# Patient Record
Sex: Male | Born: 1955 | Race: White | Hispanic: No | Marital: Married | Smoking: Never smoker
Health system: Southern US, Community
[De-identification: ages and names within clinical notes are randomized; demographics above are authoritative.]

## PROBLEM LIST (undated history)

## (undated) ENCOUNTER — Emergency Department: Discharge status: Absent without leave | Payer: Self-pay

## (undated) DIAGNOSIS — E66812 Obesity, class 2: Secondary | ICD-10-CM

## (undated) DIAGNOSIS — E669 Obesity, unspecified: Secondary | ICD-10-CM

## (undated) DIAGNOSIS — S82892A Other fracture of left lower leg, initial encounter for closed fracture: Secondary | ICD-10-CM

## (undated) DIAGNOSIS — R55 Syncope and collapse: Secondary | ICD-10-CM

## (undated) DIAGNOSIS — E785 Hyperlipidemia, unspecified: Secondary | ICD-10-CM

## (undated) DIAGNOSIS — K635 Polyp of colon: Secondary | ICD-10-CM

## (undated) HISTORY — DX: Syncope and collapse: R55

## (undated) HISTORY — DX: Obesity, class 2: E66.812

## (undated) HISTORY — PX: COLONOSCOPY W/ POLYPECTOMY: SHX1380

## (undated) HISTORY — DX: Other fracture of left lower leg, initial encounter for closed fracture: S82.892A

## (undated) HISTORY — DX: Polyp of colon: K63.5

## (undated) HISTORY — PX: HEMORRHOID SURGERY: SHX153

## (undated) HISTORY — DX: Obesity, unspecified: E66.9

## (undated) HISTORY — DX: Hyperlipidemia, unspecified: E78.5

---
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
## 2011-03-30 ENCOUNTER — Ambulatory Visit (INDEPENDENT_AMBULATORY_CARE_PROVIDER_SITE_OTHER): Payer: BC Managed Care – PPO | Admitting: Family Medicine

## 2011-03-30 ENCOUNTER — Encounter: Payer: Self-pay | Admitting: Family Medicine

## 2011-03-30 ENCOUNTER — Telehealth: Payer: Self-pay | Admitting: Family Medicine

## 2011-03-30 VITALS — BP 124/70 | HR 74 | Temp 98.5°F | Ht 75.0 in | Wt 291.0 lb

## 2011-03-30 DIAGNOSIS — L919 Hypertrophic disorder of the skin, unspecified: Secondary | ICD-10-CM

## 2011-03-30 DIAGNOSIS — L909 Atrophic disorder of skin, unspecified: Secondary | ICD-10-CM

## 2011-03-30 DIAGNOSIS — Z0000 Encounter for general adult medical examination without abnormal findings: Secondary | ICD-10-CM

## 2011-03-30 DIAGNOSIS — L918 Other hypertrophic disorders of the skin: Secondary | ICD-10-CM | POA: Insufficient documentation

## 2011-03-30 LAB — COMPREHENSIVE METABOLIC PANEL
ALT: 26 U/L (ref 0–53)
AST: 20 U/L (ref 0–37)
CO2: 31 mEq/L (ref 19–32)
Calcium: 9.5 mg/dL (ref 8.4–10.5)
Chloride: 107 mEq/L (ref 96–112)
GFR: 59.71 mL/min — ABNORMAL LOW (ref >60.00)
Sodium: 144 mEq/L (ref 135–145)
Total Protein: 6.7 g/dL (ref 6.0–8.3)

## 2011-03-30 LAB — CBC WITH DIFFERENTIAL/PLATELET
Basophils Absolute: 0 10*3/uL (ref 0.0–0.1)
Eosinophils Absolute: 0.2 10*3/uL (ref 0.0–0.7)
HCT: 48.4 % (ref 39.0–52.0)
Hemoglobin: 15.9 g/dL (ref 13.0–17.0)
Lymphs Abs: 1.8 10*3/uL (ref 0.7–4.0)
MCHC: 32.9 g/dL (ref 30.0–36.0)
MCV: 91.1 fl (ref 78.0–100.0)
Monocytes Absolute: 0.5 10*3/uL (ref 0.1–1.0)
Neutro Abs: 4.5 10*3/uL (ref 1.4–7.7)
RDW: 13.1 % (ref 11.5–14.6)

## 2011-03-30 LAB — TSH: TSH: 1.93 u[IU]/mL (ref 0.35–5.50)

## 2011-03-30 NOTE — Telephone Encounter (Signed)
Done

## 2011-03-30 NOTE — Assessment & Plan Note (Signed)
Discussed primary problem of being overweight: prudent diet and exercise recommendations reviewed, HM handout for adult males reviewed and given to patient. Reviewed age appropriate health screening and vaccines.  He declined flu vaccine today.  We'll see if old records show date of last Tetanus. Will review old records for date of his colonoscopy and whether the polyps were adenomatous or hyperplastic and go from there as far as recommendation for next screening colonoscopy. FLP, CBC, CMET, TSH, and PSA done today. He deferred the GU and rectal exams. Next CPE 1 yr.

## 2011-03-30 NOTE — Patient Instructions (Signed)
Health Maintenance in Males MAINTAIN REGULAR HEALTH EXAMS  Maintain a healthy diet and normal weight. Increased weight leads to problems with blood pressure and diabetes. Decrease fat in the diet and increase exercise. Obtain a proper diet from your caregiver if necessary.   High blood pressure causes heart and blood vessel problems. Check blood pressures regularly and keep your blood pressure at normal limits. Aerobic exercise helps this. Persistent elevations of blood pressure should be treated with medications if weight loss and exercise are ineffective.   Avoid smoking, drinking in excess (more than 2 drinks per day), or use of street drugs. Do not share needles with anyone. Ask for help if you need assistance or instructions on stopping the use of alcohol, cigarettes, or drugs.   Maintain normal blood lipids and cholesterol. Your caregiver can give you information to lower your risk of heart disease or stroke.   Ask your caregiver if you are in need of early heart disease screening because of a strong family history of heart disease or signs of elevated testosterone (male sex hormone) levels. These can predispose you to early heart disease.   Practice safe sex. Practicing safe sex decreases your risk for a sexually transmitted infection (STI). Some of the STIs are gonorrhea, chlamydia, syphilis, trichimonas, herpes, human papillomavirus (HPV), and human immunodeficiency virus (HIV). Herpes, HIV, and HPV are viral illnesses that have no cure. These can result in disability, cancer, and death.   It is not safe for someone who has AIDS or is HIV positive to have unprotected sex with a partner who is HIV positive. The reason for this is the fact that there are many different strains of HIV. If you have a strain that is readily treated with medications and then suddenly introduce a strain from a partner that has no further treatment options, you may suddenly have a strain of HIV that is untreatable.  Even if you are both positive for HIV, it is still necessary to practice safe sex.   Use sunscreen with a SPF of 15 or greater. Being outside in the sun when your shadow caused by the sun is shorter than you are, means you are being exposed to sun at greater intensity. Lighter skinned people are at a greater risk of skin cancer.   Keep carbon monoxide and smoke detectors in your home and functioning at all times. Change the batteries every 6 months.   Do monthly examinations of your testicles. The best time to do this is after a hot shower or bath when the tissues are loose. Notify your caregivers of any lumps, tenderness, or changes in size or shape.   Notify your caregiver of new moles or changes in moles, especially if there is a change in shape or color. Also notify your caregiver if a mole is larger than the size of a pencil eraser.   Stay current with your tetanus shots and other required immunizations.  The Body Mass Index (BMI) is a way of measuring how much of your body is fat. Having a BMI above 27 increases the risk of heart disease, diabetes, hypertension, stroke, and other problems related to obesity. Document Released: 12/01/2007 Document Re-Released: 11/22/2009 Exit[employer] Patient Information 2011 [employer]. 

## 2011-03-30 NOTE — Telephone Encounter (Signed)
Pls request records from Dr. Jaynie Collins in [place], [place].  Thx--PM

## 2011-03-30 NOTE — Assessment & Plan Note (Signed)
Three in right axilla and 2 in left: these were removed today via simple excision with scalpel.  Pt tolerated procedure well, no immediate complications.

## 2011-03-30 NOTE — Progress Notes (Signed)
Office Note 03/30/2011  CC:  Chief Complaint  Patient presents with  . Annual Exam    no complaints    HPI:  Raymond Griffith is a 55 y.o. White male who is here to establish care and get CPE. Patient's most recent primary MD: Dr. Jaynie Collins in [place], [place]. Old records were not reviewed prior to or during today's visit.  Feeling well, no acute complaints. Asks if I can remove a few skin tags in his armpits today.   Past Medical History  Diagnosis Date  . Colon polyps approx age 74-48    Screening: Pt doesn't recall whether adenomatous or hyperplastic  This screening was done a bit early but patient admits he is NOT high risk.    Past Surgical History  Procedure Date  . Tonsillectomy and adenoidectomy   Hemorrhoidectomy  Family History  Problem Relation Age of Onset  . Diabetes Mother   . Diabetes Father   . Cancer Paternal Grandmother     ovarian    History   Social History  . Marital Status: Married    Spouse Name: N/A    Number of Children: N/A  . Years of Education: N/A   Occupational History  . Not on file.   Social History Main Topics  . Smoking status: Never Smoker   . Smokeless tobacco: Never Used  . Alcohol Use: Not on file  . Drug Use: Not on file  . Sexually Active: Not on file   Other Topics Concern  . Not on file   Social History Narrative   Married, one adult son.Originally from [place] but went to [employer], got accounting degree.Works as an Airline pilot (corperate taxes).Exercises in spurts only.  No T/A/Ds.    Outpatient Encounter Prescriptions as of 03/30/2011  Medication Sig Dispense Refill  . Multiple Vitamins-Minerals (CENTRUM SILVER PO) Take 1 tablet by mouth daily.          No Known Allergies  ROS Review of Systems  Constitutional: Negative for fever, chills, appetite change and fatigue.  HENT: Negative for ear pain, congestion, sore throat, neck stiffness and dental problem.   Eyes: Negative for discharge, redness  and visual disturbance.  Respiratory: Negative for cough, chest tightness, shortness of breath and wheezing.   Cardiovascular: Negative for chest pain, palpitations and leg swelling.  Gastrointestinal: Negative for nausea, vomiting, abdominal pain, diarrhea and blood in stool.  Genitourinary: Negative for dysuria, urgency, frequency, hematuria, flank pain and difficulty urinating.  Musculoskeletal: Negative for myalgias, back pain, joint swelling and arthralgias.  Skin: Negative for pallor and rash.  Neurological: Negative for dizziness, speech difficulty, weakness and headaches.  Hematological: Negative for adenopathy. Does not bruise/bleed easily.  Psychiatric/Behavioral: Negative for confusion and sleep disturbance. The patient is not nervous/anxious.      PE; Blood pressure 124/70, pulse 74, temperature 98.5 F (36.9 C), temperature source Oral, height 6\' 3"  (1.905 m), weight 291 lb (131.997 kg), SpO2 96.00%. Gen: Alert, well appearing.  Patient is oriented to person, place, time, and situation. HEENT: Scalp without lesions or hair loss.  Ears: EACs clear, normal epithelium.  TMs with good light reflex and landmarks bilaterally.  Eyes: no injection, icteris, swelling, or exudate.  EOMI, PERRLA. Nose: no drainage or turbinate edema/swelling.  No injection or focal lesion.  Mouth: lips without lesion/swelling.  Oral mucosa pink and moist.  Dentition intact and without obvious caries or gingival swelling.  Oropharynx without erythema, exudate, or swelling.  Neck: supple, ROM full.  Carotids 2+ bilat,  without bruit.  No lymphadenopathy, thyromegaly, or mass. Chest: symmetric expansion, nonlabored respirations.  Clear and equal breath sounds in all lung fields.   CV: RRR, no m/r/g.  Peripheral pulses 2+ and symmetric. ABD: soft, NT, ND, BS normal.  No hepatospenomegaly or mass.  No bruits. EXT: no clubbing, cyanosis, or edema.  Neuro: CN 2-12 intact bilaterally, strength 5/5 in proximal and  distal upper extremities and lower extremities bilaterally.  No sensory deficits.  No tremor.  No disdiadochokinesis.  No ataxia.  Upper extremity and lower extremity DTRs symmetric.  No pronator drift. GU and rectal: patient deferred   Pertinent labs:  none  ASSESSMENT AND PLAN:   Health maintenance examination Discussed primary problem of being overweight: prudent diet and exercise recommendations reviewed, HM handout for adult males reviewed and given to patient. Reviewed age appropriate health screening and vaccines.  He declined flu vaccine today.  We'll see if old records show date of last Tetanus. Will review old records for date of his colonoscopy and whether the polyps were adenomatous or hyperplastic and go from there as far as recommendation for next screening colonoscopy. FLP, CBC, CMET, TSH, and PSA done today. He deferred the GU and rectal exams. Next CPE 1 yr.  Skin tag Three in right axilla and 2 in left: these were removed today via simple excision with scalpel.  Pt tolerated procedure well, no immediate complications.     Return in about 1 year (around 03/29/2012) for CPE.

## 2011-04-03 ENCOUNTER — Encounter: Payer: Self-pay | Admitting: *Deleted

## 2011-04-18 NOTE — Progress Notes (Signed)
Labs released to mychart

## 2012-03-23 ENCOUNTER — Emergency Department (HOSPITAL_BASED_OUTPATIENT_CLINIC_OR_DEPARTMENT_OTHER)
Admission: EM | Admit: 2012-03-23 | Discharge: 2012-03-23 | Disposition: A | Discharge status: Home / Self Care | Payer: BC Managed Care – PPO | Attending: Emergency Medicine | Admitting: Emergency Medicine

## 2012-03-23 ENCOUNTER — Emergency Department (HOSPITAL_BASED_OUTPATIENT_CLINIC_OR_DEPARTMENT_OTHER): Payer: BC Managed Care – PPO

## 2012-03-23 ENCOUNTER — Encounter (HOSPITAL_BASED_OUTPATIENT_CLINIC_OR_DEPARTMENT_OTHER): Payer: Self-pay | Admitting: *Deleted

## 2012-03-23 DIAGNOSIS — M549 Dorsalgia, unspecified: Secondary | ICD-10-CM

## 2012-03-23 MED ORDER — ONDANSETRON 4 MG PO TBDP
4.0000 mg | ORAL_TABLET | Freq: Once | ORAL | Status: AC
  Administered 2012-03-23: 4 mg via ORAL
  Filled 2012-03-23: qty 1

## 2012-03-23 MED ORDER — OXYCODONE-ACETAMINOPHEN 5-325 MG PO TABS: 2.0000 | ORAL_TABLET | ORAL | Status: DC | PRN

## 2012-03-23 MED ORDER — PROMETHAZINE HCL 25 MG/ML IJ SOLN
INTRAMUSCULAR | Status: AC
  Filled 2012-03-23: qty 1

## 2012-03-23 MED ORDER — PROMETHAZINE HCL 25 MG/ML IJ SOLN
25.0000 mg | Freq: Once | INTRAMUSCULAR | Status: AC
  Administered 2012-03-23: 25 mg via INTRAMUSCULAR

## 2012-03-23 MED ORDER — HYDROMORPHONE HCL PF 2 MG/ML IJ SOLN
2.0000 mg | Freq: Once | INTRAMUSCULAR | Status: DC
  Filled 2012-03-23: qty 1

## 2012-03-23 MED ORDER — KETOROLAC TROMETHAMINE 60 MG/2ML IM SOLN
60.0000 mg | Freq: Once | INTRAMUSCULAR | Status: AC
  Administered 2012-03-23: 60 mg via INTRAMUSCULAR

## 2012-03-23 MED ORDER — METHYLPREDNISOLONE SODIUM SUCC 125 MG IJ SOLR
125.0000 mg | Freq: Once | INTRAMUSCULAR | Status: DC
  Filled 2012-03-23: qty 2

## 2012-03-23 MED ORDER — KETOROLAC TROMETHAMINE 60 MG/2ML IM SOLN
60.0000 mg | Freq: Once | INTRAMUSCULAR | Status: DC
  Filled 2012-03-23: qty 2

## 2012-03-23 MED ORDER — PROMETHAZINE HCL 25 MG PO TABS: 25.0000 mg | ORAL_TABLET | Freq: Four times a day (QID) | ORAL | Status: DC | PRN

## 2012-03-23 MED ORDER — METHYLPREDNISOLONE SODIUM SUCC 125 MG IJ SOLR
125.0000 mg | Freq: Once | INTRAMUSCULAR | Status: AC
  Administered 2012-03-23: 125 mg via INTRAMUSCULAR

## 2012-03-23 MED ORDER — HYDROMORPHONE HCL PF 2 MG/ML IJ SOLN
2.0000 mg | Freq: Once | INTRAMUSCULAR | Status: AC
  Administered 2012-03-23: 2 mg via INTRAMUSCULAR

## 2012-03-23 NOTE — ED Provider Notes (Signed)
Medical screening examination/treatment/procedure(s) were performed by non-physician practitioner and as supervising physician I was immediately available for consultation/collaboration.   Gelsey Amyx, MD 03/23/12 2328 

## 2012-03-23 NOTE — ED Notes (Signed)
Pt vomited large amount of emesis.  Pt resting for phenergan to peak.

## 2012-03-23 NOTE — ED Notes (Signed)
Pt states he was moving a TV Friday and injured his back. Went to [facility] yesterday and was seen at an UC. Given injection and Rx to "last through the weekend" No better this a.m.

## 2012-03-23 NOTE — ED Provider Notes (Signed)
History     CSN: [id number]  Arrival date & time 03/23/12  1343   First MD Initiated Contact with Patient 03/23/12 1524      Chief Complaint  Patient presents with  . Back Pain    (Consider location/radiation/quality/duration/timing/severity/associated sxs/prior treatment) Patient is a 56 y.o. male presenting with back pain. The history is provided by the patient. No language interpreter was used.  Back Pain  This is a new problem. Episode onset: 3 days ago. The problem occurs constantly. The problem has been gradually worsening. The pain is associated with lifting heavy objects. The pain is present in the lumbar spine. The quality of the pain is described as stabbing. The pain does not radiate. The pain is at a severity of 8/10. The patient is experiencing no pain. The pain is the same all the time. Stiffness is present all day. He has tried NSAIDs and muscle relaxants for the symptoms. The treatment provided significant relief.  Pt was seen in at an Urgent car in [place] and started on robaxin and tramadol with mi,imal relief  Past Medical History  Diagnosis Date  . Colon polyps approx age 87-48    Screening: Pt doesn't recall whether adenomatous or hyperplastic    Past Surgical History  Procedure Date  . Tonsillectomy and adenoidectomy 1967  . Hemorrhoid surgery in his 61s    Family History  Problem Relation Age of Onset  . Diabetes Mother   . Diabetes Father   . Cancer Paternal Grandmother     ovarian    History  Substance Use Topics  . Smoking status: Never Smoker   . Smokeless tobacco: Never Used  . Alcohol Use: Not on file      Review of Systems  Musculoskeletal: Positive for back pain.  All other systems reviewed and are negative.    Allergies  Review of patient's allergies indicates no known allergies.  Home Medications   Current Outpatient Rx  Name Route Sig Dispense Refill  . METHOCARBAMOL 750 MG PO TABS Oral Take 750 mg by mouth 4 (four)  times daily.    . TRAMADOL HCL 50 MG PO TABS Oral Take 50 mg by mouth every 6 (six) hours as needed.    . CENTRUM SILVER PO Oral Take 1 tablet by mouth daily.        BP 153/84  Pulse 72  Temp 97.7 F (36.5 C) (Oral)  Resp 20  Ht 6\' 4"  (1.93 m)  Wt 303 lb (137.44 kg)  BMI 36.88 kg/m2  SpO2 99%  Physical Exam  Nursing note and vitals reviewed. Constitutional: He is oriented to person, place, and time. He appears well-developed and well-nourished.  HENT:  Head: Normocephalic.  Cardiovascular: Normal rate.   Pulmonary/Chest: Effort normal.  Abdominal: Soft.  Musculoskeletal:       Tender lumbar spine,  diffusely  Neurological: He is alert and oriented to person, place, and time. He has normal reflexes.  Skin: Skin is warm.    ED Course  Procedures (including critical care time)  Labs Reviewed - No data to display Dg Lumbar Spine Complete  03/23/2012  *RADIOLOGY REPORT*  Clinical Data: Back pain following lifting injury 2 days ago.  LUMBAR SPINE - COMPLETE 4+ VIEW  Comparison: None.  Findings: There are five lumbar type vertebral bodies.  The alignment is normal.  The disc spaces are preserved.  There are small paraspinal osteophytes at L3-L4.  There is no evidence of acute fracture or pars defect.  IMPRESSION: No  acute osseous findings or malalignment.   Original Report Authenticated By: Gerrianne Scale, M.D.      No diagnosis found.    MDM  Pt given solumedrol,  Dilaudid and torodol.   Pt reports feeling better.   I advised pt to follow up with his Md for recheck tomorrow.   Pt given rx for percocet        Lonia Skinner [place], [place] 03/23/12 1714

## 2012-03-23 NOTE — ED Notes (Addendum)
Pt is feeling better.  No more emesis.  Pt feeling up to going home.

## 2012-03-23 NOTE — ED Notes (Signed)
Pt vomitted large amount emesis.  K Sofia notified.  Orders received.

## 2012-08-02 ENCOUNTER — Other Ambulatory Visit: Payer: Self-pay

## 2013-02-13 ENCOUNTER — Encounter: Payer: Self-pay | Admitting: Family Medicine

## 2013-02-13 ENCOUNTER — Ambulatory Visit (INDEPENDENT_AMBULATORY_CARE_PROVIDER_SITE_OTHER): Payer: BC Managed Care – PPO | Admitting: Family Medicine

## 2013-02-13 VITALS — BP 142/88 | HR 78 | Temp 97.0°F | Resp 18 | Ht 75.0 in | Wt 311.0 lb

## 2013-02-13 DIAGNOSIS — Z1211 Encounter for screening for malignant neoplasm of colon: Secondary | ICD-10-CM

## 2013-02-13 DIAGNOSIS — K625 Hemorrhage of anus and rectum: Secondary | ICD-10-CM

## 2013-02-13 DIAGNOSIS — Z0000 Encounter for general adult medical examination without abnormal findings: Secondary | ICD-10-CM

## 2013-02-13 DIAGNOSIS — Z0389 Encounter for observation for other suspected diseases and conditions ruled out: Secondary | ICD-10-CM

## 2013-02-13 LAB — COMPREHENSIVE METABOLIC PANEL
ALT: 27 U/L (ref 0–53)
AST: 22 U/L (ref 0–37)
Albumin: 4.2 g/dL (ref 3.5–5.2)
Alkaline Phosphatase: 75 U/L (ref 39–117)
Calcium: 9.6 mg/dL (ref 8.4–10.5)
Chloride: 104 mEq/L (ref 96–112)
Potassium: 4.2 mEq/L (ref 3.5–5.1)

## 2013-02-13 LAB — CBC WITH DIFFERENTIAL/PLATELET
Basophils Absolute: 0 10*3/uL (ref 0.0–0.1)
Eosinophils Absolute: 0.2 10*3/uL (ref 0.0–0.7)
Lymphocytes Relative: 23.5 % (ref 12.0–46.0)
Monocytes Relative: 6.8 % (ref 3.0–12.0)
Neutrophils Relative %: 66.6 % (ref 43.0–77.0)
Platelets: 246 10*3/uL (ref 150.0–400.0)
RDW: 13.2 % (ref 11.5–14.6)

## 2013-02-13 LAB — LIPID PANEL
HDL: 61.9 mg/dL (ref >39.00)
Total CHOL/HDL Ratio: 3

## 2013-02-13 LAB — LDL CHOLESTEROL, DIRECT: Direct LDL: 141.5 mg/dL

## 2013-02-13 LAB — PSA: PSA: 1.03 ng/mL (ref 0.10–4.00)

## 2013-02-13 LAB — TSH: TSH: 1.67 u[IU]/mL (ref 0.35–5.50)

## 2013-02-13 NOTE — Assessment & Plan Note (Signed)
Reviewed age and gender appropriate health maintenance issues (prudent diet, regular exercise, health risks of tobacco and excessive alcohol, use of seatbelts, fire alarms in home, use of sunscreen).  Also reviewed age and gender appropriate health screening as well as vaccine recommendations. Pt declined flu vaccine today. Refer to GI for colon cancer screening (hx of polyps) + mild rectal bleeding intermittently. DRE normal today. HP labs + PSA today. If left forearm papule does not go away in 4mo I recommended he return for shave excision to send for pathology.

## 2013-02-13 NOTE — Patient Instructions (Signed)
Health Maintenance, Males A healthy lifestyle and preventative care can promote health and wellness.  Maintain regular health, dental, and eye exams.  Eat a healthy diet. Foods like vegetables, fruits, whole grains, low-fat dairy products, and lean protein foods contain the nutrients you need without too many calories. Decrease your intake of foods high in solid fats, added sugars, and salt. Get information about a proper diet from your caregiver, if necessary.  Regular physical exercise is one of the most important things you can do for your health. Most adults should get at least 150 minutes of moderate-intensity exercise (any activity that increases your heart rate and causes you to sweat) each week. In addition, most adults need muscle-strengthening exercises on 2 or more days a week.   Maintain a healthy weight. The body mass index (BMI) is a screening tool to identify possible weight problems. It provides an estimate of body fat based on height and weight. Your caregiver can help determine your BMI, and can help you achieve or maintain a healthy weight. For adults 20 years and older:  A BMI below 18.5 is considered underweight.  A BMI of 18.5 to 24.9 is normal.  A BMI of 25 to 29.9 is considered overweight.  A BMI of 30 and above is considered obese.  Maintain normal blood lipids and cholesterol by exercising and minimizing your intake of saturated fat. Eat a balanced diet with plenty of fruits and vegetables. Blood tests for lipids and cholesterol should begin at age 20 and be repeated every 5 years. If your lipid or cholesterol levels are high, you are over 50, or you are a high risk for heart disease, you may need your cholesterol levels checked more frequently.Ongoing high lipid and cholesterol levels should be treated with medicines, if diet and exercise are not effective.  If you smoke, find out from your caregiver how to quit. If you do not use tobacco, do not start.  If you  choose to drink alcohol, do not exceed 2 drinks per day. One drink is considered to be 12 ounces (355 mL) of beer, 5 ounces (148 mL) of wine, or 1.5 ounces (44 mL) of liquor.  Avoid use of street drugs. Do not share needles with anyone. Ask for help if you need support or instructions about stopping the use of drugs.  High blood pressure causes heart disease and increases the risk of stroke. Blood pressure should be checked at least every 1 to 2 years. Ongoing high blood pressure should be treated with medicines if weight loss and exercise are not effective.  If you are 45 to 55 years old, ask your caregiver if you should take aspirin to prevent heart disease.  Diabetes screening involves taking a blood sample to check your fasting blood sugar level. This should be done once every 3 years, after age 45, if you are within normal weight and without risk factors for diabetes. Testing should be considered at a younger age or be carried out more frequently if you are overweight and have at least 1 risk factor for diabetes.  Colorectal cancer can be detected and often prevented. Most routine colorectal cancer screening begins at the age of 50 and continues through age 75. However, your caregiver may recommend screening at an earlier age if you have risk factors for colon cancer. On a yearly basis, your caregiver may provide home test kits to check for hidden blood in the stool. Use of a small camera at the end of a tube,   to directly examine the colon (sigmoidoscopy or colonoscopy), can detect the earliest forms of colorectal cancer. Talk to your caregiver about this at age 50, when routine screening begins. Direct examination of the colon should be repeated every 5 to 10 years through age 75, unless early forms of pre-cancerous polyps or small growths are found.  Hepatitis C blood testing is recommended for all people born from 1945 through 1965 and any individual with known risks for hepatitis C.  Healthy  men should no longer receive prostate-specific antigen (PSA) blood tests as part of routine cancer screening. Consult with your caregiver about prostate cancer screening.  Testicular cancer screening is not recommended for adolescents or adult males who have no symptoms. Screening includes self-exam, caregiver exam, and other screening tests. Consult with your caregiver about any symptoms you have or any concerns you have about testicular cancer.  Practice safe sex. Use condoms and avoid high-risk sexual practices to reduce the spread of sexually transmitted infections (STIs).  Use sunscreen with a sun protection factor (SPF) of 30 or greater. Apply sunscreen liberally and repeatedly throughout the day. You should seek shade when your shadow is shorter than you. Protect yourself by wearing long sleeves, pants, a wide-brimmed hat, and sunglasses year round, whenever you are outdoors.  Notify your caregiver of new moles or changes in moles, especially if there is a change in shape or color. Also notify your caregiver if a mole is larger than the size of a pencil eraser.  A one-time screening for abdominal aortic aneurysm (AAA) and surgical repair of large AAAs by sound wave imaging (ultrasonography) is recommended for ages 65 to 75 years who are current or former smokers.  Stay current with your immunizations. Document Released: 12/01/2007 Document Revised: 08/27/2011 Document Reviewed: 10/30/2010 [employer] Patient Information 2014 [employer].  

## 2013-02-13 NOTE — Progress Notes (Signed)
Office Note 02/13/2013  CC:  Chief Complaint  Patient presents with  . Annual Exam    HPI:  Raymond Griffith is a 57 y.o. White male who is here for CPE. Pt mentions that he has occas small amounts of BRBPR--maybe once every few weeks, painless. No melena, no abd pain, no signif constipation and no diarrhea.  Also has noted a small cyst coming up on right shoulder area, not painful. Also has noted a small, firm bump on left elbow region, came up in the last month or two, painless, no itching. He picks at it a lot.     Past Medical History  Diagnosis Date  . Colon polyps approx age 29-48    Screening: Pt doesn't recall whether adenomatous or hyperplastic    Past Surgical History  Procedure Laterality Date  . Tonsillectomy and adenoidectomy  1967  . Hemorrhoid surgery  in his 79s    Family History  Problem Relation Age of Onset  . Diabetes Mother   . Diabetes Father   . Cancer Paternal Grandmother     ovarian    History   Social History  . Marital Status: Married    Spouse Name: [name]    Number of Children: N/A  . Years of Education: N/A   Occupational History  .  [employer]   Social History Main Topics  . Smoking status: Never Smoker   . Smokeless tobacco: Never Used  . Alcohol Use: Yes     Comment: socially  . Drug Use: No  . Sexual Activity: Not on file   Other Topics Concern  . Not on file   Social History Narrative   Married, one adult son.   Originally from [place] but went to [employer], got accounting degree.   Works as an Airline pilot (corperate taxes).   Exercises in spurts only.  No T/A/Ds.    Outpatient Prescriptions Prior to Visit  Medication Sig Dispense Refill  . Multiple Vitamins-Minerals (CENTRUM SILVER PO) Take 1 tablet by mouth daily.        . methocarbamol (ROBAXIN) 750 MG tablet Take 750 mg by mouth 4 (four) times daily.      Marland Kitchen oxyCODONE-acetaminophen (PERCOCET/ROXICET) 5-325 MG per tablet Take 2 tablets by mouth every 4  (four) hours as needed for pain.  20 tablet  0  . promethazine (PHENERGAN) 25 MG tablet Take 1 tablet (25 mg total) by mouth every 6 (six) hours as needed for nausea.  20 tablet  0  . traMADol (ULTRAM) 50 MG tablet Take 50 mg by mouth every 6 (six) hours as needed.       No facility-administered medications prior to visit.    No Known Allergies  ROS Review of Systems  Constitutional: Negative for fever, chills, appetite change and fatigue.  HENT: Negative for ear pain, congestion, sore throat, neck stiffness and dental problem.   Eyes: Negative for discharge, redness and visual disturbance.  Respiratory: Negative for cough, chest tightness, shortness of breath and wheezing.   Cardiovascular: Negative for chest pain, palpitations and leg swelling.  Gastrointestinal: Positive for anal bleeding (as per HPI). Negative for nausea, vomiting, abdominal pain, diarrhea and blood in stool.  Genitourinary: Negative for dysuria, urgency, frequency, hematuria, flank pain and difficulty urinating.  Musculoskeletal: Negative for myalgias, back pain, joint swelling and arthralgias.  Skin: Negative for pallor and rash.       As per HPI   Neurological: Negative for dizziness, speech difficulty, weakness and headaches.  Hematological:  Negative for adenopathy. Does not bruise/bleed easily.  Psychiatric/Behavioral: Negative for confusion and sleep disturbance. The patient is not nervous/anxious.      PE; Blood pressure 142/88, pulse 78, temperature 97 F (36.1 C), temperature source Temporal, resp. rate 18, height 6\' 3"  (1.905 m), weight 311 lb (141.069 kg), SpO2 96.00%. Gen: Alert, well appearing, overweight-appearing.   Patient is oriented to person, place, time, and situation. AFFECT: pleasant, lucid thought and speech. ENT: Ears: EACs clear, normal epithelium.  TMs with good light reflex and landmarks bilaterally.  Eyes: no injection, icteris, swelling, or exudate.  EOMI, PERRLA. Nose: no drainage or  turbinate edema/swelling.  No injection or focal lesion.  Mouth: lips without lesion/swelling.  Oral mucosa pink and moist.  Dentition intact and without obvious caries or gingival swelling.  Oropharynx without erythema, exudate, or swelling.  Neck: supple/nontender.  No LAD, mass, or TM.  Carotid pulses 2+ bilaterally, without bruits. CV: RRR, no m/r/g.   LUNGS: CTA bilat, nonlabored resps, good aeration in all lung fields. ABD: soft, NT, ND, BS normal.  No hepatospenomegaly or mass.  No bruits. EXT: no clubbing, cyanosis, or edema.  Musculoskeletal: no joint swelling, erythema, warmth, or tenderness.  ROM of all joints intact. Skin - no sores or rashes or color changes.  Left lateral elbow region with pearly papule with slight flaky/crusted roof.  No erythema or pigment.  No tenderness.  Right posterolat shoulder with small epidermal inclusion cyst--no erythema or tenderness. Rectal exam: negative without mass, lesions or tenderness.  Anal exam shows no hemorrhoids or fissures.   PROSTATE EXAM: smooth and symmetric without nodules or tenderness.  Pertinent labs:  None today  ASSESSMENT AND PLAN:   Health maintenance examination Reviewed age and gender appropriate health maintenance issues (prudent diet, regular exercise, health risks of tobacco and excessive alcohol, use of seatbelts, fire alarms in home, use of sunscreen).  Also reviewed age and gender appropriate health screening as well as vaccine recommendations. Pt declined flu vaccine today. Refer to GI for colon cancer screening (hx of polyps) + mild rectal bleeding intermittently. DRE normal today. HP labs + PSA today. If left forearm papule does not go away in 54mo I recommended he return for shave excision to send for pathology.   An After Visit Summary was printed and given to the patient.  FOLLOW UP:  Return in about 1 year (around 02/13/2014) for CPE.

## 2013-02-23 ENCOUNTER — Encounter: Payer: Self-pay | Admitting: Internal Medicine

## 2013-04-06 ENCOUNTER — Ambulatory Visit (AMBULATORY_SURGERY_CENTER): Payer: Self-pay | Admitting: *Deleted

## 2013-04-06 ENCOUNTER — Telehealth: Payer: Self-pay | Admitting: *Deleted

## 2013-04-06 VITALS — Ht 76.0 in | Wt 307.2 lb

## 2013-04-06 DIAGNOSIS — Z86010 Personal history of colonic polyps: Secondary | ICD-10-CM

## 2013-04-06 MED ORDER — MOVIPREP 100 G PO SOLR: ORAL | Status: DC

## 2013-04-06 NOTE — Telephone Encounter (Signed)
Pt scheduled for direct colonoscopy 04/21/2013 with Dr. Marina Goodell.  Pt thinks last colonoscopy was 5 to 7 years ago.  Was at [facility] but does not know the name of MD that performed procedure.  He says he had polyps but does not know what kind.  Release of information form signed and given to Alonna Buckler, CMA.  Dr. Marina Goodell: patient says that he has had some "intermittent bright red rectal bleeding; maybe once every few weeks, painless.  No melena, no abd pain, no signif constipation and no diarrhea. " per Dr Dietrich Pates OV 02/13/13.  Documented in EPIC.    Is he okay for direct colon (once records received from prior colonoscopy) or does he need OV with you first? Thanks, [name] in [facility]

## 2013-04-06 NOTE — Progress Notes (Signed)
No allergies to eggs or soy. No problems with anesthesia.  

## 2013-04-06 NOTE — Telephone Encounter (Signed)
Pt notified that okay to proceed with colonoscopy; no office visit needed.

## 2013-04-06 NOTE — Telephone Encounter (Signed)
Okay for direct schedule colonoscopy 

## 2013-04-08 ENCOUNTER — Encounter: Payer: Self-pay | Admitting: Internal Medicine

## 2013-04-21 ENCOUNTER — Ambulatory Visit (AMBULATORY_SURGERY_CENTER): Payer: BC Managed Care – PPO | Admitting: Internal Medicine

## 2013-04-21 ENCOUNTER — Encounter: Payer: Self-pay | Admitting: Internal Medicine

## 2013-04-21 VITALS — BP 177/92 | HR 72 | Temp 97.9°F | Resp 30 | Ht 76.0 in | Wt 307.0 lb

## 2013-04-21 DIAGNOSIS — D126 Benign neoplasm of colon, unspecified: Secondary | ICD-10-CM

## 2013-04-21 DIAGNOSIS — Z86010 Personal history of colonic polyps: Secondary | ICD-10-CM

## 2013-04-21 MED ORDER — SODIUM CHLORIDE 0.9 % IV SOLN: 500.0000 mL | INTRAVENOUS | Status: DC

## 2013-04-21 NOTE — Progress Notes (Signed)
Called to room to assist during endoscopic procedure.  Patient ID and intended procedure confirmed with present staff. Received instructions for my participation in the procedure from the performing physician.  

## 2013-04-21 NOTE — Progress Notes (Signed)
Patient did not have preoperative order for IV antibiotic SSI prophylaxis. (G8918)  Patient did not experience any of the following events: a burn prior to discharge; a fall within the facility; wrong site/side/patient/procedure/implant event; or a hospital transfer or hospital admission upon discharge from the facility. (G8907)  

## 2013-04-21 NOTE — Patient Instructions (Signed)
YOU HAD AN ENDOSCOPIC PROCEDURE TODAY AT THE [facility]: Refer to the procedure report that was given to you for any specific questions about what was found during the examination.  If the procedure report does not answer your questions, please call your gastroenterologist to clarify.  If you requested that your care partner not be given the details of your procedure findings, then the procedure report has been included in a sealed envelope for you to review at your convenience later.  YOU SHOULD EXPECT: Some feelings of bloating in the abdomen. Passage of more gas than usual.  Walking can help get rid of the air that was put into your GI tract during the procedure and reduce the bloating. If you had a lower endoscopy (such as a colonoscopy or flexible sigmoidoscopy) you may notice spotting of blood in your stool or on the toilet paper. If you underwent a bowel prep for your procedure, then you may not have a normal bowel movement for a few days.  DIET: Your first meal following the procedure should be a light meal and then it is ok to progress to your normal diet.  A half-sandwich or bowl of soup is an example of a good first meal.  Heavy or fried foods are harder to digest and may make you feel nauseous or bloated.  Likewise meals heavy in dairy and vegetables can cause extra gas to form and this can also increase the bloating.  Drink plenty of fluids but you should avoid alcoholic beverages for 24 hours.  ACTIVITY: Your care partner should take you home directly after the procedure.  You should plan to take it easy, moving slowly for the rest of the day.  You can resume normal activity the day after the procedure however you should NOT DRIVE or use heavy machinery for 24 hours (because of the sedation medicines used during the test).    SYMPTOMS TO REPORT IMMEDIATELY: A gastroenterologist can be reached at any hour.  During normal business hours, 8:30 AM to 5:00 PM Monday through Friday,  call [phone].  After hours and on weekends, please call the GI answering service at [phone] who will take a message and have the physician on call contact you.   Following lower endoscopy (colonoscopy or flexible sigmoidoscopy):  Excessive amounts of blood in the stool  Significant tenderness or worsening of abdominal pains  Swelling of the abdomen that is new, acute  Fever of 100F or higher  FOLLOW UP: If any biopsies were taken you will be contacted by phone or by letter within the next 1-3 weeks.  Call your gastroenterologist if you have not heard about the biopsies in 3 weeks.  Our staff will call the home number listed on your records the next business day following your procedure to check on you and address any questions or concerns that you may have at that time regarding the information given to you following your procedure. This is a courtesy call and so if there is no answer at the home number and we have not heard from you through the emergency physician on call, we will assume that you have returned to your regular daily activities without incident.  SIGNATURES/CONFIDENTIALITY: You and/or your care partner have signed paperwork which will be entered into your electronic medical record.  These signatures attest to the fact that that the information above on your After Visit Summary has been reviewed and is understood.  Full responsibility of the confidentiality of this   discharge information lies with you and/or your care-partner.  Recommendations See procedure report  

## 2013-04-21 NOTE — Op Note (Addendum)
[facility] [address] [place], [address]   COLONOSCOPY PROCEDURE REPORT  PATIENT: [name], Raymond Griffith.  MR#: [id number] BIRTHDATE: 1955-08-27 , 57  yrs. old GENDER: Male ENDOSCOPIST: Roxy Cedar, MD REFERRED XB:[id number] Milinda Cave, M.D. PROCEDURE DATE:  04/21/2013 PROCEDURE:   Colonoscopy with snare polypectomy x 3 First Screening Colonoscopy - Avg.  risk and is 50 yrs.  old or older - No.  Prior Negative Screening - Now for repeat screening. N/A  History of Adenoma - Now for follow-up colonoscopy & has been > or = to 3 yrs.  N/A  Polyps Removed Today? Yes. ASA CLASS:   Class I INDICATIONS:average risk screening.   Reports colonoscopy and polyp removal in [place] 10 years ago (no details) MEDICATIONS: MAC sedation, administered by CRNA and propofol (Diprivan) 500mg  IV  DESCRIPTION OF PROCEDURE:   After the risks benefits and alternatives of the procedure were thoroughly explained, informed consent was obtained.  A digital rectal exam revealed no abnormalities of the rectum.   The LB [id number]  endoscope was introduced through the anus and advanced to the cecum, which was identified by both the appendix and ileocecal valve. No adverse events experienced.   The quality of the prep was good, using MoviPrep  The instrument was then slowly withdrawn as the colon was fully examined.   COLON FINDINGS: Three polyps ranging between 3-72mm in size were found in the ascending colon, sigmoid colon, and rectum.  A polypectomy was performed with a cold snare.  The resection was complete and the polyp tissue was completely retrieved.   Mild diverticulosis was noted in the sigmoid colon.   The colon mucosa was otherwise normal.  Retroflexed views revealed internal hemorrhoids. The time to cecum=2 minutes 11 seconds.  Withdrawal time=14 minutes 04 seconds.  The scope was withdrawn and the procedure completed. COMPLICATIONS: There were no complications.  ENDOSCOPIC  IMPRESSION: 1. Three small colon polyps - removed 2. Mild diverticulosis 3. Otherwise normal exam  RECOMMENDATIONS: 1. Repeat colonoscopy in 5 years if polyp adenomatous; otherwise 10 years  eSigned:  Roxy Cedar, MD 04/21/2013 9:02 AM Revised: 04/21/2013 9:02 AM  cc: Earley Favor, MD and The Patient

## 2013-04-21 NOTE — Progress Notes (Signed)
Report to pacu rn, vss, bbs=clear 

## 2013-04-22 ENCOUNTER — Telehealth: Payer: Self-pay | Admitting: *Deleted

## 2013-04-22 NOTE — Telephone Encounter (Signed)
Message left

## 2013-04-23 ENCOUNTER — Other Ambulatory Visit: Payer: Self-pay

## 2013-04-27 ENCOUNTER — Encounter: Payer: Self-pay | Admitting: Internal Medicine

## 2014-03-05 IMAGING — CR DG LUMBAR SPINE COMPLETE 4+V
5 series · 5 of 5 positions shown · non-contrast
Comparison: None.

CLINICAL DATA: Back pain following lifting injury 2 days ago.

LUMBAR SPINE - COMPLETE 4+ VIEW

[t l-spine a.p.]
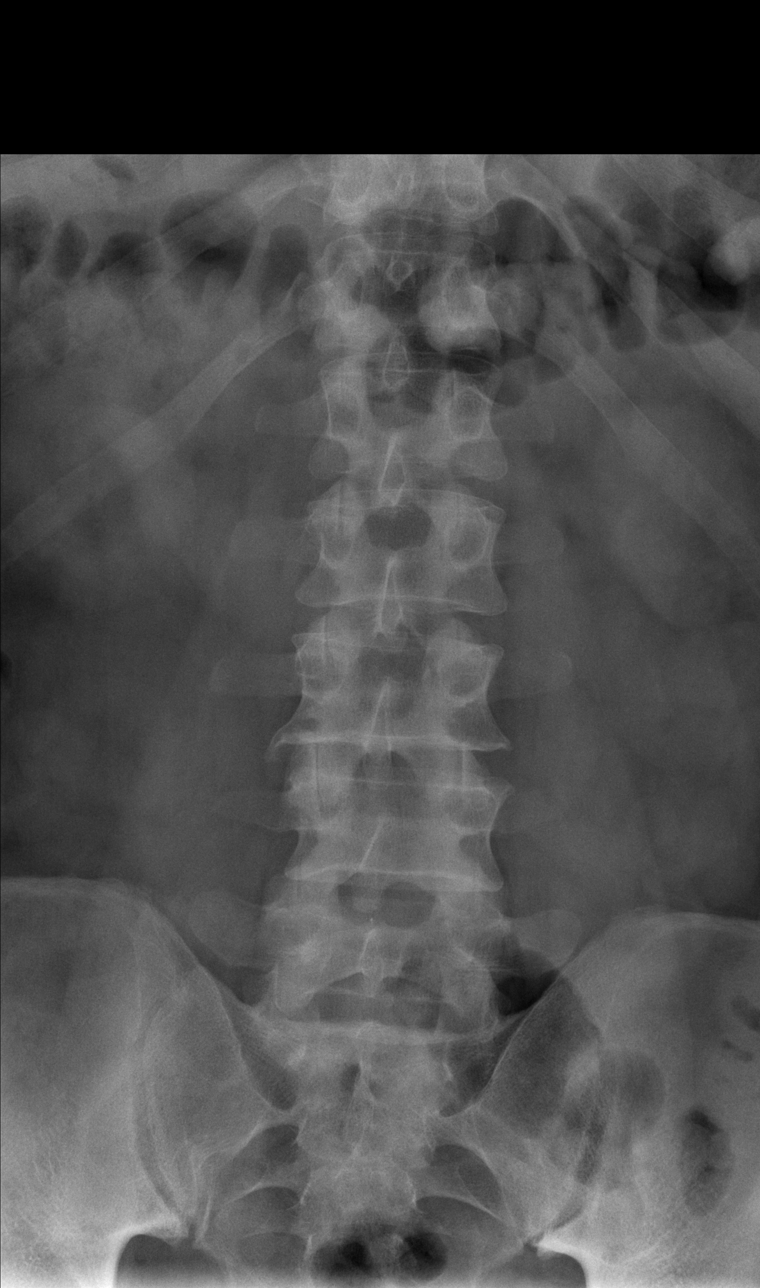

[t l-spine oblique exposure (1 of 2)]
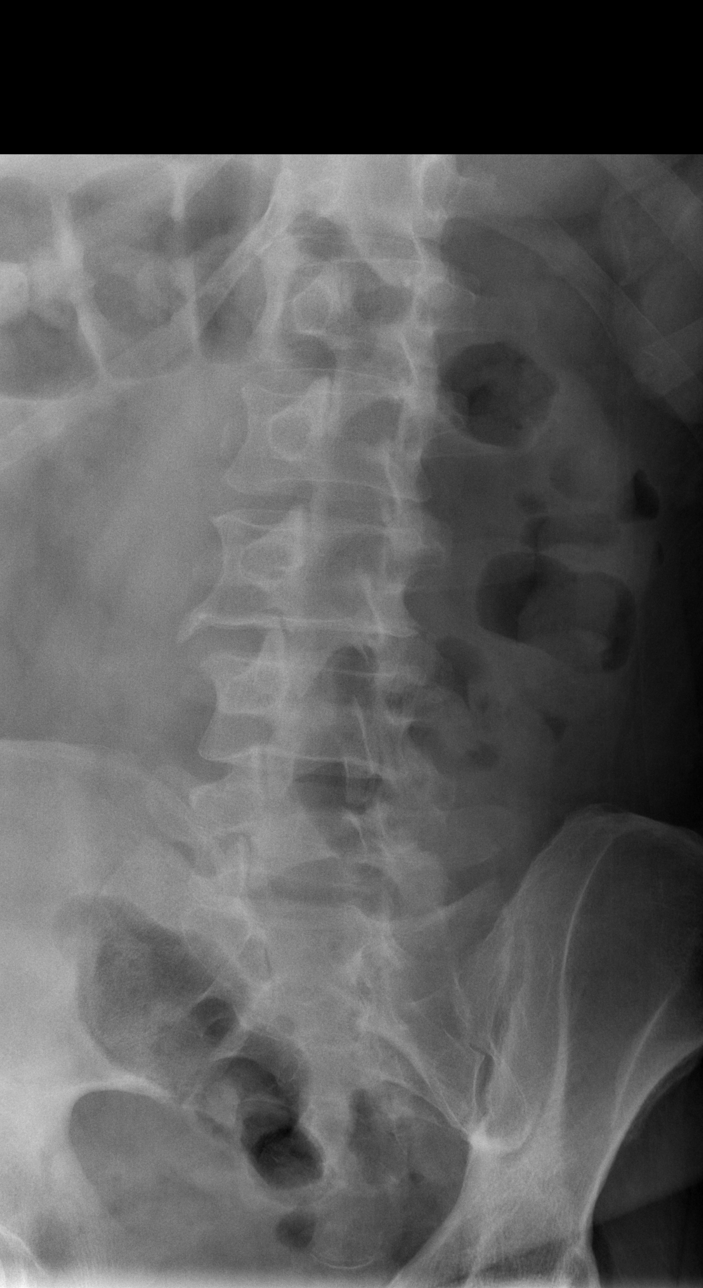

[t l-spine oblique exposure (2 of 2)]
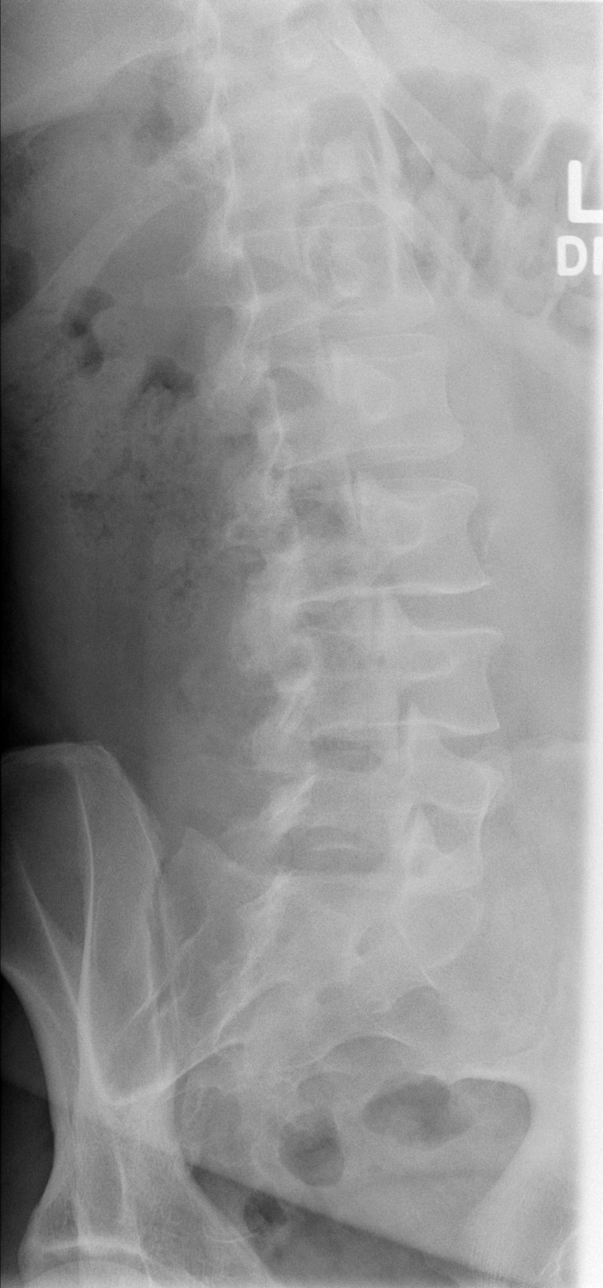

[t l-spine lat]
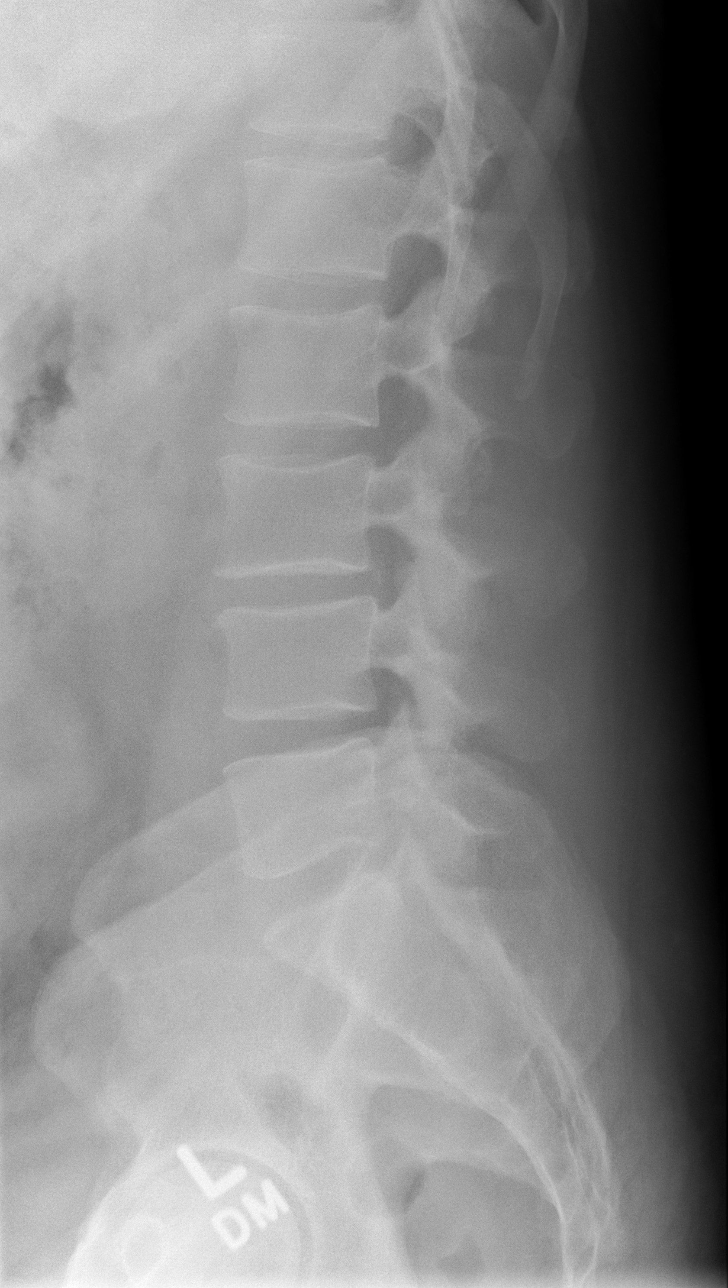

[t l-spine l5-s1 spot]
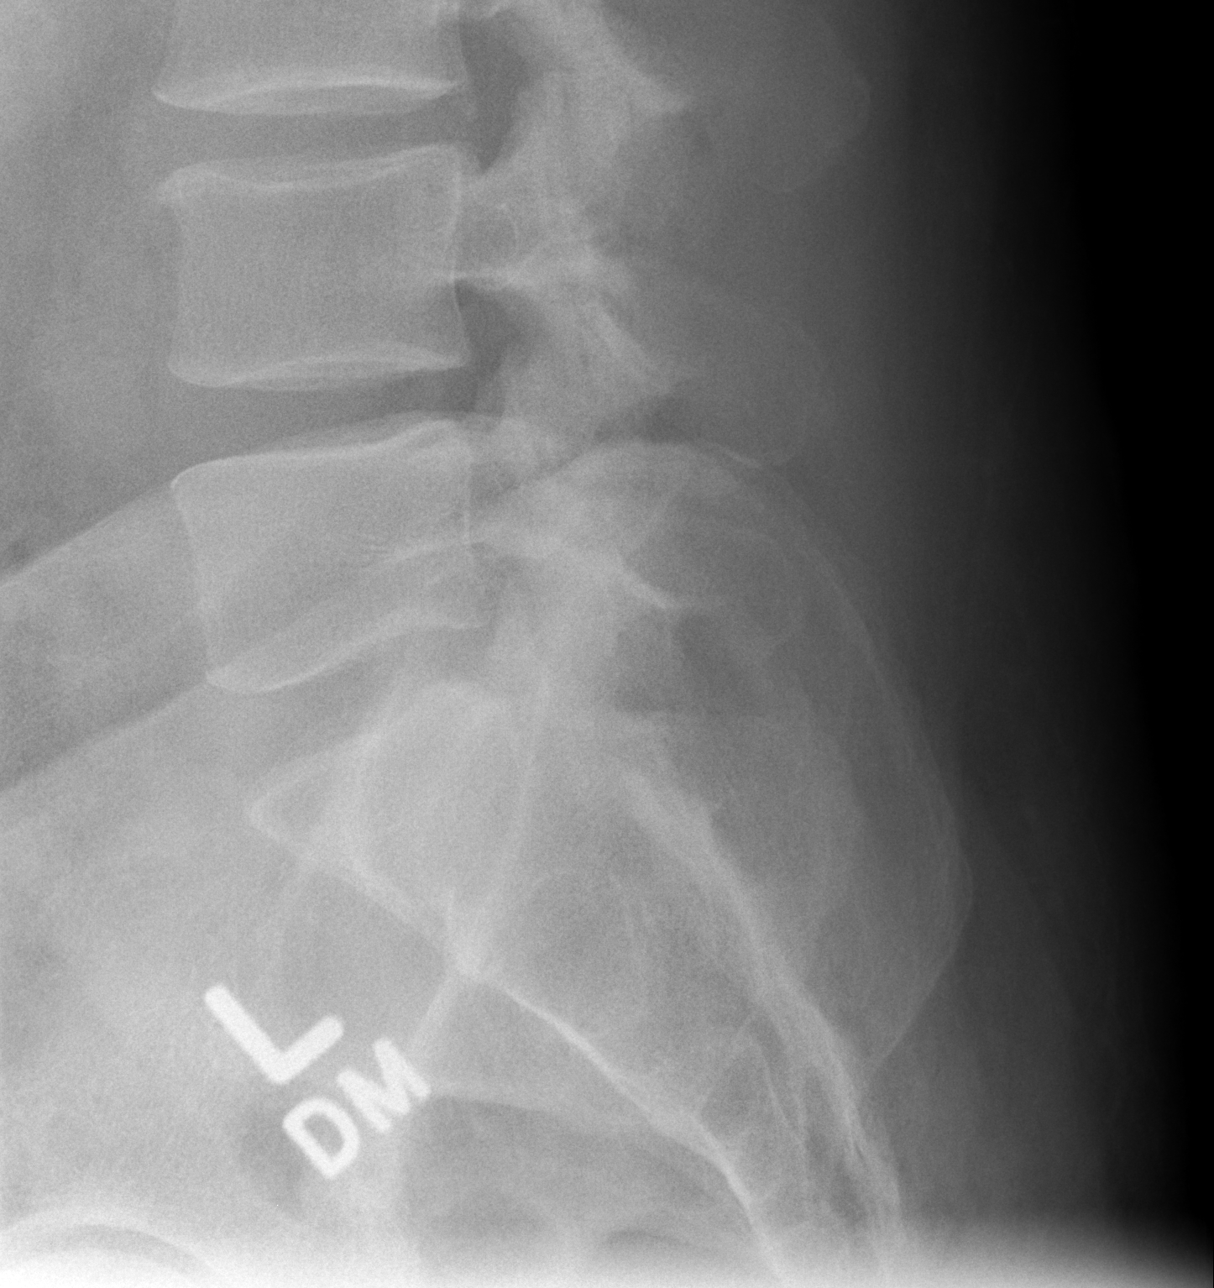

[5 of 5 positions shown; findings below may reference images not displayed]

FINDINGS: There are five lumbar type vertebral bodies.  The
alignment is normal.  The disc spaces are preserved.  There are
small paraspinal osteophytes at L3-L4.  There is no evidence of
acute fracture or pars defect.
IMPRESSION: No acute osseous findings or malalignment.

## 2015-03-09 ENCOUNTER — Ambulatory Visit (INDEPENDENT_AMBULATORY_CARE_PROVIDER_SITE_OTHER): Payer: BLUE CROSS/BLUE SHIELD | Admitting: Family Medicine

## 2015-03-09 ENCOUNTER — Encounter: Payer: Self-pay | Admitting: Family Medicine

## 2015-03-09 VITALS — BP 132/83 | HR 58 | Temp 97.9°F | Resp 16 | Ht 74.25 in | Wt 300.0 lb

## 2015-03-09 DIAGNOSIS — Z0000 Encounter for general adult medical examination without abnormal findings: Secondary | ICD-10-CM

## 2015-03-09 DIAGNOSIS — Z125 Encounter for screening for malignant neoplasm of prostate: Secondary | ICD-10-CM | POA: Diagnosis not present

## 2015-03-09 NOTE — Progress Notes (Signed)
Pre visit review using our clinic review tool, if applicable. No additional management support is needed unless otherwise documented below in the visit note. 

## 2015-03-09 NOTE — Progress Notes (Signed)
Office Note 03/09/2015  CC:  Chief Complaint  Patient presents with  . Annual Exam    Pt is fasting.   HPI:  Raymond Griffith is a 59 y.o. White male who is here for annual health maintenance exam-fasting. No acute complaints. Dental and eye exams UTD. Exercise: minimal.  Diet: good, has lost 20 lb purposefully with dieting.   Past Medical History  Diagnosis Date  . Colon polyps approx age 93-48    Initial screening: Pt doesn't recall whether adenomatous or hyperplastic.  2014-adenomatous (recall 04/2018)    Past Surgical History  Procedure Laterality Date  . Tonsillectomy and adenoidectomy  1967  . Hemorrhoid surgery  in his 45s  . Colonoscopy w/ polypectomy  approx 2006; 04/2013    04/2013 adenomatous--recall 5 yrs    Family History  Problem Relation Age of Onset  . Diabetes Mother   . Diabetes Father   . Cancer Paternal Grandmother     ovarian  . Colon cancer Neg Hx     Social History   Social History  . Marital Status: Married    Spouse Name: Juliann Pulse  . Number of Children: N/A  . Years of Education: N/A   Occupational History  .  [employer]   Social History Main Topics  . Smoking status: Never Smoker   . Smokeless tobacco: Never Used  . Alcohol Use: 1.8 oz/week    3 Cans of beer per week  . Drug Use: No  . Sexual Activity: Not on file   Other Topics Concern  . Not on file   Social History Narrative   Married, one adult son.   Originally from [place] but went to [employer], got accounting degree.   Works as an Optometrist (corperate taxes).   Exercises in spurts only.  No T/A/Ds.   MEDS: MVI qd  No Known Allergies  ROS Review of Systems  Constitutional: Negative for fever, chills, appetite change and fatigue.  HENT: Negative for congestion, dental problem, ear pain and sore throat.   Eyes: Negative for discharge, redness and visual disturbance.  Respiratory: Negative for cough, chest tightness, shortness of breath and wheezing.    Cardiovascular: Negative for chest pain, palpitations and leg swelling.  Gastrointestinal: Negative for nausea, vomiting, abdominal pain, diarrhea and blood in stool.  Genitourinary: Negative for dysuria, urgency, frequency, hematuria, flank pain and difficulty urinating.  Musculoskeletal: Negative for myalgias, back pain, joint swelling, arthralgias and neck stiffness.  Skin: Negative for pallor and rash.  Neurological: Negative for dizziness, speech difficulty, weakness and headaches.  Hematological: Negative for adenopathy. Does not bruise/bleed easily.  Psychiatric/Behavioral: Negative for confusion and sleep disturbance. The patient is not nervous/anxious.     PE: Blood pressure 132/83, pulse 58, temperature 97.9 F (36.6 C), temperature source Oral, resp. rate 16, height 6' 2.25" (1.886 m), weight 300 lb (136.079 kg), SpO2 98 %. Gen: Alert, well appearing.  Patient is oriented to person, place, time, and situation. AFFECT: pleasant, lucid thought and speech. ENT: Ears: EACs clear, normal epithelium.  TMs with good light reflex and landmarks bilaterally.  Eyes: no injection, icteris, swelling, or exudate.  EOMI, PERRLA. Nose: no drainage or turbinate edema/swelling.  No injection or focal lesion.  Mouth: lips without lesion/swelling.  Oral mucosa pink and moist.  Dentition intact and without obvious caries or gingival swelling.  Oropharynx without erythema, exudate, or swelling.  Neck: supple/nontender.  No LAD, mass, or TM.  Carotid pulses 2+ bilaterally, without bruits. CV: RRR, no m/r/g.  LUNGS: CTA bilat, nonlabored resps, good aeration in all lung fields. ABD: soft, NT, ND, BS normal.  No hepatospenomegaly or mass.  No bruits. EXT: no clubbing, cyanosis, or edema.  Musculoskeletal: no joint swelling, erythema, warmth, or tenderness.  ROM of all joints intact. Skin - no sores or suspicious lesions or rashes or color changes Rectal exam: negative without mass, lesions or tenderness,  PROSTATE EXAM: smooth and symmetric without nodules or tenderness.   Pertinent labs:  Lab Results  Component Value Date   TSH 1.67 02/13/2013   Lab Results  Component Value Date   WBC 7.3 02/13/2013   HGB 15.2 02/13/2013   HCT 46.1 02/13/2013   MCV 89.4 02/13/2013   PLT 246.0 02/13/2013   Lab Results  Component Value Date   CREATININE 1.3 02/13/2013   BUN 18 02/13/2013   NA 142 02/13/2013   K 4.2 02/13/2013   CL 104 02/13/2013   CO2 27 02/13/2013   Lab Results  Component Value Date   ALT 27 02/13/2013   AST 22 02/13/2013   ALKPHOS 75 02/13/2013   BILITOT 0.6 02/13/2013   Lab Results  Component Value Date   CHOL 205* 02/13/2013   Lab Results  Component Value Date   HDL 61.90 02/13/2013   Lab Results  Component Value Date   LDLCALC 123* 03/30/2011   Lab Results  Component Value Date   TRIG 66.0 02/13/2013   Lab Results  Component Value Date   CHOLHDL 3 02/13/2013   Lab Results  Component Value Date   PSA 1.03 02/13/2013   PSA 1.27 03/30/2011   ASSESSMENT AND PLAN:   Health maintenance exam: Reviewed age and gender appropriate health maintenance issues (prudent diet, regular exercise, health risks of tobacco and excessive alcohol, use of seatbelts, fire alarms in home, use of sunscreen).  Also reviewed age and gender appropriate health screening as well as vaccine recommendations. He declined flu vaccine today, also declined HIV and Hep C screening today. Fasting HP and PSA done today, DRE normal. Next colonoscopy due after 04/2018.  An After Visit Summary was printed and given to the patient.  FOLLOW UP:  Return in about 1 year (around 03/08/2016) for annual CPE (fasting).

## 2015-03-10 LAB — LIPID PANEL
CHOLESTEROL: 195 mg/dL (ref 0–200)
HDL: 55.4 mg/dL (ref >39.00)
LDL CALC: 117 mg/dL — AB (ref 0–99)
NonHDL: 139.61
TRIGLYCERIDES: 114 mg/dL (ref 0.0–149.0)
Total CHOL/HDL Ratio: 4
VLDL: 22.8 mg/dL (ref 0.0–40.0)

## 2015-03-10 LAB — CBC WITH DIFFERENTIAL/PLATELET
BASOS ABS: 0 10*3/uL (ref 0.0–0.1)
Basophils Relative: 0.6 % (ref 0.0–3.0)
EOS ABS: 0.2 10*3/uL (ref 0.0–0.7)
Eosinophils Relative: 2.1 % (ref 0.0–5.0)
HEMATOCRIT: 49.6 % (ref 39.0–52.0)
HEMOGLOBIN: 16.5 g/dL (ref 13.0–17.0)
LYMPHS PCT: 26.1 % (ref 12.0–46.0)
Lymphs Abs: 2.1 10*3/uL (ref 0.7–4.0)
MCHC: 33.2 g/dL (ref 30.0–36.0)
MCV: 88.7 fl (ref 78.0–100.0)
MONO ABS: 0.4 10*3/uL (ref 0.1–1.0)
Monocytes Relative: 5.1 % (ref 3.0–12.0)
NEUTROS ABS: 5.4 10*3/uL (ref 1.4–7.7)
Neutrophils Relative %: 66.1 % (ref 43.0–77.0)
PLATELETS: 224 10*3/uL (ref 150.0–400.0)
RBC: 5.59 Mil/uL (ref 4.22–5.81)
RDW: 13.6 % (ref 11.5–15.5)
WBC: 8.2 10*3/uL (ref 4.0–10.5)

## 2015-03-10 LAB — TSH: TSH: 1.7 u[IU]/mL (ref 0.35–4.50)

## 2015-03-10 LAB — COMPREHENSIVE METABOLIC PANEL
ALBUMIN: 4.6 g/dL (ref 3.5–5.2)
ALK PHOS: 74 U/L (ref 39–117)
ALT: 25 U/L (ref 0–53)
AST: 20 U/L (ref 0–37)
BILIRUBIN TOTAL: 0.8 mg/dL (ref 0.2–1.2)
BUN: 16 mg/dL (ref 6–23)
CALCIUM: 9.5 mg/dL (ref 8.4–10.5)
CHLORIDE: 102 meq/L (ref 96–112)
CO2: 29 mEq/L (ref 19–32)
CREATININE: 1.22 mg/dL (ref 0.40–1.50)
GFR: 65.02 mL/min (ref >60.00)
Glucose, Bld: 90 mg/dL (ref 70–99)
Potassium: 3.7 mEq/L (ref 3.5–5.1)
SODIUM: 140 meq/L (ref 135–145)
TOTAL PROTEIN: 6.9 g/dL (ref 6.0–8.3)

## 2015-03-10 LAB — PSA: PSA: 1.18 ng/mL (ref 0.10–4.00)

## 2016-03-08 ENCOUNTER — Encounter: Payer: Self-pay | Admitting: Family Medicine

## 2016-03-08 ENCOUNTER — Ambulatory Visit (INDEPENDENT_AMBULATORY_CARE_PROVIDER_SITE_OTHER): Payer: BLUE CROSS/BLUE SHIELD | Admitting: Family Medicine

## 2016-03-08 VITALS — BP 136/91 | HR 66 | Temp 98.1°F | Resp 18 | Ht 75.5 in | Wt 303.4 lb

## 2016-03-08 DIAGNOSIS — Z125 Encounter for screening for malignant neoplasm of prostate: Secondary | ICD-10-CM

## 2016-03-08 DIAGNOSIS — Z0000 Encounter for general adult medical examination without abnormal findings: Secondary | ICD-10-CM

## 2016-03-08 LAB — COMPREHENSIVE METABOLIC PANEL
ALT: 21 U/L (ref 0–53)
AST: 17 U/L (ref 0–37)
Albumin: 4.3 g/dL (ref 3.5–5.2)
Alkaline Phosphatase: 76 U/L (ref 39–117)
BILIRUBIN TOTAL: 0.6 mg/dL (ref 0.2–1.2)
BUN: 19 mg/dL (ref 6–23)
CO2: 31 meq/L (ref 19–32)
CREATININE: 1.25 mg/dL (ref 0.40–1.50)
Calcium: 9.6 mg/dL (ref 8.4–10.5)
Chloride: 104 mEq/L (ref 96–112)
GFR: 63 mL/min (ref >60.00)
GLUCOSE: 85 mg/dL (ref 70–99)
Potassium: 4.4 mEq/L (ref 3.5–5.1)
SODIUM: 142 meq/L (ref 135–145)
Total Protein: 6.7 g/dL (ref 6.0–8.3)

## 2016-03-08 LAB — LIPID PANEL
CHOL/HDL RATIO: 3
Cholesterol: 189 mg/dL (ref 0–200)
HDL: 65.3 mg/dL (ref >39.00)
LDL Cholesterol: 106 mg/dL — ABNORMAL HIGH (ref 0–99)
NONHDL: 124.17
Triglycerides: 91 mg/dL (ref 0.0–149.0)
VLDL: 18.2 mg/dL (ref 0.0–40.0)

## 2016-03-08 LAB — TSH: TSH: 2.27 u[IU]/mL (ref 0.35–4.50)

## 2016-03-08 LAB — PSA: PSA: 0.88 ng/mL (ref 0.10–4.00)

## 2016-03-08 NOTE — Progress Notes (Signed)
Office Note 03/08/2016  CC:  Chief Complaint  Patient presents with  . Annual Exam    CPE    HPI:  Raymond Griffith is a 60 y.o. White male who is here for annual health maintenance exam. Eye exam UTD. Dentist UTD.  No acute complaints.  Past Medical History:  Diagnosis Date  . Colon polyps approx age 76-48   Initial screening: Pt doesn't recall whether adenomatous or hyperplastic.  2014-adenomatous (recall 04/2018)    Past Surgical History:  Procedure Laterality Date  . COLONOSCOPY W/ POLYPECTOMY  approx 2006; 04/2013   04/2013 adenomatous--recall 5 yrs  . HEMORRHOID SURGERY  in his 88s  . TONSILLECTOMY AND ADENOIDECTOMY  1967    Family History  Problem Relation Age of Onset  . Diabetes Mother   . Diabetes Father   . Cancer Paternal Grandmother     ovarian  . Colon cancer Neg Hx     Social History   Social History  . Marital status: Married    Spouse name: Juliann Pulse  . Number of children: N/A  . Years of education: N/A   Occupational History  .  [employer]   Social History Main Topics  . Smoking status: Never Smoker  . Smokeless tobacco: Never Used  . Alcohol use 1.8 oz/week    3 Cans of beer per week  . Drug use: No  . Sexual activity: Not on file   Other Topics Concern  . Not on file   Social History Narrative   Married, one adult son.   Originally from [place] but went to [employer], got accounting degree.   Works as an Optometrist (corperate taxes).   Exercises in spurts only.  No T/A/Ds.    Outpatient Medications Prior to Visit  Medication Sig Dispense Refill  . Multiple Vitamins-Minerals (CENTRUM SILVER PO) Take 1 tablet by mouth daily.       No facility-administered medications prior to visit.     No Known Allergies  ROS Review of Systems  Constitutional: Negative for appetite change, chills, fatigue and fever.  HENT: Negative for congestion, dental problem, ear pain and sore throat.   Eyes: Negative for discharge, redness and  visual disturbance.  Respiratory: Negative for cough, chest tightness, shortness of breath and wheezing.   Cardiovascular: Negative for chest pain, palpitations and leg swelling.  Gastrointestinal: Negative for abdominal pain, blood in stool, diarrhea, nausea and vomiting.  Genitourinary: Negative for difficulty urinating, dysuria, flank pain, frequency, hematuria and urgency.  Musculoskeletal: Negative for arthralgias, back pain, joint swelling, myalgias and neck stiffness.  Skin: Negative for pallor and rash.  Neurological: Negative for dizziness, speech difficulty, weakness and headaches.  Hematological: Negative for adenopathy. Does not bruise/bleed easily.  Psychiatric/Behavioral: Negative for confusion and sleep disturbance. The patient is not nervous/anxious.     PE; Blood pressure (!) 136/91, pulse 66, temperature 98.1 F (36.7 C), temperature source Oral, resp. rate 18, height 6' 3.5" (1.918 m), weight (!) 303 lb 6.4 oz (137.6 kg), SpO2 97 %. Gen: Alert, well appearing.  Patient is oriented to person, place, time, and situation. AFFECT: pleasant, lucid thought and speech. ENT: Ears: EACs clear, normal epithelium.  TMs with good light reflex and landmarks bilaterally.  Eyes: no injection, icteris, swelling, or exudate.  EOMI, PERRLA. Nose: no drainage or turbinate edema/swelling.  No injection or focal lesion.  Mouth: lips without lesion/swelling.  Oral mucosa pink and moist.  Dentition intact and without obvious caries or gingival swelling.  Oropharynx without erythema,  exudate, or swelling.  Neck: supple/nontender.  No LAD, mass, or TM.  Carotid pulses 2+ bilaterally, without bruits. CV: RRR, no m/r/g.   LUNGS: CTA bilat, nonlabored resps, good aeration in all lung fields. ABD: soft, NT, ND, BS normal.  No hepatospenomegaly or mass.  No bruits. EXT: no clubbing, cyanosis, or edema.  Musculoskeletal: no joint swelling, erythema, warmth, or tenderness.  ROM of all joints intact. Skin  - no sores or suspicious lesions or rashes or color changes Rectal exam: negative without mass, lesions or tenderness, PROSTATE EXAM: smooth and symmetric without nodules or tenderness.  Pertinent labs:  Lab Results  Component Value Date   TSH 1.70 03/09/2015   Lab Results  Component Value Date   WBC 8.2 03/09/2015   HGB 16.5 03/09/2015   HCT 49.6 03/09/2015   MCV 88.7 03/09/2015   PLT 224.0 03/09/2015   Lab Results  Component Value Date   CREATININE 1.22 03/09/2015   BUN 16 03/09/2015   NA 140 03/09/2015   K 3.7 03/09/2015   CL 102 03/09/2015   CO2 29 03/09/2015   Lab Results  Component Value Date   ALT 25 03/09/2015   AST 20 03/09/2015   ALKPHOS 74 03/09/2015   BILITOT 0.8 03/09/2015   Lab Results  Component Value Date   CHOL 195 03/09/2015   Lab Results  Component Value Date   HDL 55.40 03/09/2015   Lab Results  Component Value Date   LDLCALC 117 (H) 03/09/2015   Lab Results  Component Value Date   TRIG 114.0 03/09/2015   Lab Results  Component Value Date   CHOLHDL 4 03/09/2015   Lab Results  Component Value Date   PSA 1.18 03/09/2015   PSA 1.03 02/13/2013   PSA 1.27 03/30/2011    ASSESSMENT AND PLAN:   Health maintenance exam: Reviewed age and gender appropriate health maintenance issues (prudent diet, regular exercise, health risks of tobacco and excessive alcohol, use of seatbelts, fire alarms in home, use of sunscreen).  Also reviewed age and gender appropriate health screening as well as vaccine recommendations. Fasting HP today. He declined flu and Tdap vaccines today. Prostate ca screening: DRE normal, PSA drawn today. Colon ca screening:  Next TCS due 04/2018.  An After Visit Summary was printed and given to the patient.  FOLLOW UP:  Return in about 1 year (around 03/08/2017) for annual CPE (fasting).  Signed:  Crissie Sickles, MD           03/08/2016

## 2016-03-08 NOTE — Progress Notes (Signed)
Pre visit review using our clinic review tool, if applicable. No additional management support is needed unless otherwise documented below in the visit note. 

## 2016-03-09 LAB — CBC WITH DIFFERENTIAL/PLATELET
Basophils Absolute: 0 10*3/uL (ref 0.0–0.1)
Basophils Relative: 0.5 % (ref 0.0–3.0)
EOS PCT: 2.8 % (ref 0.0–5.0)
Eosinophils Absolute: 0.2 10*3/uL (ref 0.0–0.7)
HCT: 48 % (ref 39.0–52.0)
Hemoglobin: 16.2 g/dL (ref 13.0–17.0)
LYMPHS ABS: 2.1 10*3/uL (ref 0.7–4.0)
Lymphocytes Relative: 23.9 % (ref 12.0–46.0)
MCHC: 33.8 g/dL (ref 30.0–36.0)
MCV: 87.6 fl (ref 78.0–100.0)
MONOS PCT: 4.3 % (ref 3.0–12.0)
Monocytes Absolute: 0.4 10*3/uL (ref 0.1–1.0)
NEUTROS ABS: 6 10*3/uL (ref 1.4–7.7)
NEUTROS PCT: 68.5 % (ref 43.0–77.0)
PLATELETS: 200 10*3/uL (ref 150.0–400.0)
RBC: 5.48 Mil/uL (ref 4.22–5.81)
RDW: 13.3 % (ref 11.5–15.5)
WBC: 8.8 10*3/uL (ref 4.0–10.5)

## 2016-03-10 ENCOUNTER — Emergency Department
Admission: EM | Admit: 2016-03-10 | Discharge: 2016-03-10 | Discharge status: Absent without leave | Payer: BLUE CROSS/BLUE SHIELD | Source: Home / Self Care

## 2016-03-14 ENCOUNTER — Other Ambulatory Visit: Payer: BLUE CROSS/BLUE SHIELD

## 2016-06-18 DIAGNOSIS — E785 Hyperlipidemia, unspecified: Secondary | ICD-10-CM

## 2016-06-18 HISTORY — DX: Hyperlipidemia, unspecified: E78.5

## 2017-02-20 ENCOUNTER — Encounter: Payer: Self-pay | Admitting: Family Medicine

## 2017-02-20 ENCOUNTER — Ambulatory Visit (INDEPENDENT_AMBULATORY_CARE_PROVIDER_SITE_OTHER): Payer: BLUE CROSS/BLUE SHIELD | Admitting: Family Medicine

## 2017-02-20 VITALS — BP 124/76 | HR 72 | Temp 98.1°F | Resp 16 | Ht 75.0 in | Wt 301.5 lb

## 2017-02-20 DIAGNOSIS — Z1211 Encounter for screening for malignant neoplasm of colon: Secondary | ICD-10-CM | POA: Diagnosis not present

## 2017-02-20 DIAGNOSIS — Z23 Encounter for immunization: Secondary | ICD-10-CM

## 2017-02-20 DIAGNOSIS — Z125 Encounter for screening for malignant neoplasm of prostate: Secondary | ICD-10-CM | POA: Diagnosis not present

## 2017-02-20 DIAGNOSIS — Z0000 Encounter for general adult medical examination without abnormal findings: Secondary | ICD-10-CM | POA: Diagnosis not present

## 2017-02-20 NOTE — Patient Instructions (Signed)

## 2017-02-20 NOTE — Addendum Note (Signed)
Addended by: Gordy Councilman on: 02/20/2017 04:21 PM   Modules accepted: Orders

## 2017-02-20 NOTE — Progress Notes (Signed)
Office Note 02/20/2017  CC:  Chief Complaint  Patient presents with  . Annual Exam    Pt is fasting.     HPI:  Raymond Griffith is a 61 y.o. White male who is here for annual health maintenance exam. No acute complaints.  Eye exam: last week. Dental: preventatives UTD. Exercise: no regular exercise. Diet: diets intermittently.     Past Medical History:  Diagnosis Date  . Colon polyps approx age 34-48   Initial screening: Pt doesn't recall whether adenomatous or hyperplastic.  2014-adenomatous (recall 04/2018)  . Obesity, Class II, BMI 35-39.9     Past Surgical History:  Procedure Laterality Date  . COLONOSCOPY W/ POLYPECTOMY  approx 2006; 04/2013   04/2013 adenomatous--recall 5 yrs  . HEMORRHOID SURGERY  in his 68s  . TONSILLECTOMY AND ADENOIDECTOMY  1967    Family History  Problem Relation Age of Onset  . Diabetes Mother   . Diabetes Father   . Cancer Paternal Grandmother        ovarian  . Colon cancer Neg Hx     Social History   Social History  . Marital status: Married    Spouse name: Juliann Pulse  . Number of children: N/A  . Years of education: N/A   Occupational History  .  [employer]   Social History Main Topics  . Smoking status: Never Smoker  . Smokeless tobacco: Never Used  . Alcohol use 1.8 oz/week    3 Cans of beer per week  . Drug use: No  . Sexual activity: Not on file   Other Topics Concern  . Not on file   Social History Narrative   Married, one adult son.   Originally from [place] but went to [employer], got accounting degree.   Works as an Optometrist (corperate taxes).   Exercises in spurts only.  No T/A/Ds.    Outpatient Medications Prior to Visit  Medication Sig Dispense Refill  . Multiple Vitamins-Minerals (CENTRUM SILVER PO) Take 1 tablet by mouth daily.       No facility-administered medications prior to visit.     No Known Allergies  ROS Review of Systems  Constitutional: Negative for appetite change, chills,  fatigue and fever.  HENT: Negative for congestion, dental problem, ear pain and sore throat.   Eyes: Negative for discharge, redness and visual disturbance.  Respiratory: Negative for cough, chest tightness, shortness of breath and wheezing.   Cardiovascular: Negative for chest pain, palpitations and leg swelling.  Gastrointestinal: Negative for abdominal pain, blood in stool, diarrhea, nausea and vomiting.  Genitourinary: Negative for difficulty urinating, dysuria, flank pain, frequency, hematuria and urgency.  Musculoskeletal: Negative for arthralgias, back pain, joint swelling, myalgias and neck stiffness.  Skin: Negative for pallor and rash.  Neurological: Negative for dizziness, speech difficulty, weakness and headaches.  Hematological: Negative for adenopathy. Does not bruise/bleed easily.  Psychiatric/Behavioral: Negative for confusion and sleep disturbance. The patient is not nervous/anxious.     PE; Blood pressure 124/76, pulse 72, temperature 98.1 F (36.7 C), temperature source Oral, resp. rate 16, height 6\' 3"  (1.905 m), weight (!) 301 lb 8 oz (136.8 kg), SpO2 96 %. Body mass index is 37.68 kg/m.  Gen: Alert, well appearing.  Patient is oriented to person, place, time, and situation. AFFECT: pleasant, lucid thought and speech. ENT: Ears: EACs clear, normal epithelium.  TMs with good light reflex and landmarks bilaterally.  Eyes: no injection, icteris, swelling, or exudate.  EOMI, PERRLA. Nose: no drainage or turbinate  edema/swelling.  No injection or focal lesion.  Mouth: lips without lesion/swelling.  Oral mucosa pink and moist.  Dentition intact and without obvious caries or gingival swelling.  Oropharynx without erythema, exudate, or swelling.  Neck: supple/nontender.  No LAD, mass, or TM.  Carotid pulses 2+ bilaterally, without bruits. CV: RRR, no m/r/g.   LUNGS: CTA bilat, nonlabored resps, good aeration in all lung fields. ABD: soft, NT, ND, BS normal.  No  hepatospenomegaly or mass.  No bruits. EXT: no clubbing, cyanosis, or edema.  Musculoskeletal: no joint swelling, erythema, warmth, or tenderness.  ROM of all joints intact. Skin - no sores or suspicious lesions or rashes or color changes Rectal exam: negative without mass, lesions or tenderness, PROSTATE EXAM: smooth and symmetric without nodules or tenderness.   Pertinent labs:  Lab Results  Component Value Date   TSH 2.27 03/08/2016   Lab Results  Component Value Date   WBC 8.8 03/08/2016   HGB 16.2 03/08/2016   HCT 48.0 03/08/2016   MCV 87.6 03/08/2016   PLT 200.0 03/08/2016   Lab Results  Component Value Date   CREATININE 1.25 03/08/2016   BUN 19 03/08/2016   NA 142 03/08/2016   K 4.4 03/08/2016   CL 104 03/08/2016   CO2 31 03/08/2016   Lab Results  Component Value Date   ALT 21 03/08/2016   AST 17 03/08/2016   ALKPHOS 76 03/08/2016   BILITOT 0.6 03/08/2016   Lab Results  Component Value Date   CHOL 189 03/08/2016   Lab Results  Component Value Date   HDL 65.30 03/08/2016   Lab Results  Component Value Date   LDLCALC 106 (H) 03/08/2016   Lab Results  Component Value Date   TRIG 91.0 03/08/2016   Lab Results  Component Value Date   CHOLHDL 3 03/08/2016   Lab Results  Component Value Date   PSA 0.88 03/08/2016   PSA 1.18 03/09/2015   PSA 1.03 02/13/2013    ASSESSMENT AND PLAN:   Health maintenance exam: Reviewed age and gender appropriate health maintenance issues (prudent diet, regular exercise, health risks of tobacco and excessive alcohol, use of seatbelts, fire alarms in home, use of sunscreen).  Also reviewed age and gender appropriate health screening as well as vaccine recommendations. Vaccines: Tdap today.  He declined flu vaccine. Labs: fasting HP + PSA today.  He declined HIV and Hep C screening today. Prostate ca screening: DRE normal today, PSA drawn. Colon ca screening/hx of adenomatous colon poly: recall 04/2018 (Dr. Henrene Pastor).  An  After Visit Summary was printed and given to the patient.  FOLLOW UP:  Return in about 1 year (around 02/20/2018) for annual CPE (fasting).  Signed:  Crissie Sickles, MD           02/20/2017

## 2017-02-21 ENCOUNTER — Encounter: Payer: Self-pay | Admitting: *Deleted

## 2017-02-21 ENCOUNTER — Encounter: Payer: Self-pay | Admitting: Family Medicine

## 2017-02-21 LAB — CBC WITH DIFFERENTIAL/PLATELET
BASOS ABS: 0.1 10*3/uL (ref 0.0–0.1)
Basophils Relative: 0.9 % (ref 0.0–3.0)
EOS ABS: 0.2 10*3/uL (ref 0.0–0.7)
Eosinophils Relative: 2.2 % (ref 0.0–5.0)
HEMATOCRIT: 46.8 % (ref 39.0–52.0)
Hemoglobin: 15.4 g/dL (ref 13.0–17.0)
LYMPHS PCT: 23.1 % (ref 12.0–46.0)
Lymphs Abs: 1.8 10*3/uL (ref 0.7–4.0)
MCHC: 33 g/dL (ref 30.0–36.0)
MCV: 89.9 fl (ref 78.0–100.0)
Monocytes Absolute: 0.6 10*3/uL (ref 0.1–1.0)
Monocytes Relative: 7.4 % (ref 3.0–12.0)
Neutro Abs: 5.1 10*3/uL (ref 1.4–7.7)
Neutrophils Relative %: 66.4 % (ref 43.0–77.0)
PLATELETS: 250 10*3/uL (ref 150.0–400.0)
RBC: 5.21 Mil/uL (ref 4.22–5.81)
RDW: 13.5 % (ref 11.5–15.5)
WBC: 7.7 10*3/uL (ref 4.0–10.5)

## 2017-02-21 LAB — COMPREHENSIVE METABOLIC PANEL
ALBUMIN: 4.5 g/dL (ref 3.5–5.2)
ALK PHOS: 73 U/L (ref 39–117)
ALT: 18 U/L (ref 0–53)
AST: 18 U/L (ref 0–37)
BILIRUBIN TOTAL: 0.8 mg/dL (ref 0.2–1.2)
BUN: 15 mg/dL (ref 6–23)
CO2: 31 mEq/L (ref 19–32)
Calcium: 9.9 mg/dL (ref 8.4–10.5)
Chloride: 105 mEq/L (ref 96–112)
Creatinine, Ser: 1.21 mg/dL (ref 0.40–1.50)
GFR: 65.19 mL/min (ref >60.00)
GLUCOSE: 97 mg/dL (ref 70–99)
Potassium: 4.2 mEq/L (ref 3.5–5.1)
Sodium: 142 mEq/L (ref 135–145)
TOTAL PROTEIN: 6.7 g/dL (ref 6.0–8.3)

## 2017-02-21 LAB — LIPID PANEL
CHOL/HDL RATIO: 3
CHOLESTEROL: 193 mg/dL (ref 0–200)
HDL: 57 mg/dL (ref >39.00)
LDL Cholesterol: 118 mg/dL — ABNORMAL HIGH (ref 0–99)
NonHDL: 135.63
TRIGLYCERIDES: 89 mg/dL (ref 0.0–149.0)
VLDL: 17.8 mg/dL (ref 0.0–40.0)

## 2017-02-21 LAB — TSH: TSH: 1.64 u[IU]/mL (ref 0.35–4.50)

## 2017-02-21 LAB — PSA: PSA: 1.29 ng/mL (ref 0.10–4.00)

## 2017-06-18 HISTORY — PX: ORIF ANKLE FRACTURE BIMALLEOLAR: SUR920

## 2017-08-16 DIAGNOSIS — R55 Syncope and collapse: Secondary | ICD-10-CM

## 2017-08-16 HISTORY — DX: Syncope and collapse: R55

## 2017-09-02 DIAGNOSIS — S96812A Strain of other specified muscles and tendons at ankle and foot level, left foot, initial encounter: Secondary | ICD-10-CM | POA: Diagnosis not present

## 2017-09-03 DIAGNOSIS — M79672 Pain in left foot: Secondary | ICD-10-CM | POA: Diagnosis not present

## 2017-09-03 DIAGNOSIS — M25572 Pain in left ankle and joints of left foot: Secondary | ICD-10-CM | POA: Diagnosis not present

## 2017-09-03 DIAGNOSIS — S82842A Displaced bimalleolar fracture of left lower leg, initial encounter for closed fracture: Secondary | ICD-10-CM | POA: Diagnosis not present

## 2017-09-03 DIAGNOSIS — I951 Orthostatic hypotension: Secondary | ICD-10-CM | POA: Diagnosis not present

## 2017-09-03 DIAGNOSIS — M7732 Calcaneal spur, left foot: Secondary | ICD-10-CM | POA: Diagnosis not present

## 2017-09-03 DIAGNOSIS — S9302XA Subluxation of left ankle joint, initial encounter: Secondary | ICD-10-CM | POA: Diagnosis not present

## 2017-09-03 DIAGNOSIS — W19XXXA Unspecified fall, initial encounter: Secondary | ICD-10-CM | POA: Diagnosis not present

## 2017-09-03 DIAGNOSIS — R55 Syncope and collapse: Secondary | ICD-10-CM | POA: Diagnosis not present

## 2017-09-04 DIAGNOSIS — R42 Dizziness and giddiness: Secondary | ICD-10-CM | POA: Diagnosis not present

## 2017-09-04 DIAGNOSIS — S82892A Other fracture of left lower leg, initial encounter for closed fracture: Secondary | ICD-10-CM | POA: Insufficient documentation

## 2017-09-04 DIAGNOSIS — S82842A Displaced bimalleolar fracture of left lower leg, initial encounter for closed fracture: Secondary | ICD-10-CM | POA: Diagnosis not present

## 2017-09-04 DIAGNOSIS — R55 Syncope and collapse: Secondary | ICD-10-CM | POA: Insufficient documentation

## 2017-09-04 DIAGNOSIS — I951 Orthostatic hypotension: Secondary | ICD-10-CM | POA: Diagnosis not present

## 2017-09-04 DIAGNOSIS — M7732 Calcaneal spur, left foot: Secondary | ICD-10-CM | POA: Diagnosis not present

## 2017-09-04 DIAGNOSIS — R509 Fever, unspecified: Secondary | ICD-10-CM | POA: Diagnosis not present

## 2017-09-04 DIAGNOSIS — S82832A Other fracture of upper and lower end of left fibula, initial encounter for closed fracture: Secondary | ICD-10-CM | POA: Diagnosis not present

## 2017-09-04 DIAGNOSIS — J101 Influenza due to other identified influenza virus with other respiratory manifestations: Secondary | ICD-10-CM | POA: Diagnosis not present

## 2017-09-04 DIAGNOSIS — W19XXXA Unspecified fall, initial encounter: Secondary | ICD-10-CM | POA: Diagnosis not present

## 2017-09-04 DIAGNOSIS — Z79899 Other long term (current) drug therapy: Secondary | ICD-10-CM | POA: Diagnosis not present

## 2017-09-04 DIAGNOSIS — S9302XA Subluxation of left ankle joint, initial encounter: Secondary | ICD-10-CM | POA: Diagnosis not present

## 2017-09-05 DIAGNOSIS — R55 Syncope and collapse: Secondary | ICD-10-CM | POA: Diagnosis not present

## 2017-09-05 DIAGNOSIS — Z9889 Other specified postprocedural states: Secondary | ICD-10-CM

## 2017-09-05 DIAGNOSIS — S82832A Other fracture of upper and lower end of left fibula, initial encounter for closed fracture: Secondary | ICD-10-CM | POA: Diagnosis not present

## 2017-09-05 DIAGNOSIS — S82892A Other fracture of left lower leg, initial encounter for closed fracture: Secondary | ICD-10-CM | POA: Diagnosis not present

## 2017-09-05 DIAGNOSIS — Z8781 Personal history of (healed) traumatic fracture: Secondary | ICD-10-CM | POA: Insufficient documentation

## 2017-09-05 DIAGNOSIS — S82842A Displaced bimalleolar fracture of left lower leg, initial encounter for closed fracture: Secondary | ICD-10-CM | POA: Diagnosis not present

## 2017-09-05 DIAGNOSIS — I951 Orthostatic hypotension: Secondary | ICD-10-CM | POA: Diagnosis not present

## 2017-09-06 DIAGNOSIS — R42 Dizziness and giddiness: Secondary | ICD-10-CM | POA: Diagnosis not present

## 2017-09-06 DIAGNOSIS — R55 Syncope and collapse: Secondary | ICD-10-CM | POA: Diagnosis not present

## 2017-09-06 DIAGNOSIS — S82892A Other fracture of left lower leg, initial encounter for closed fracture: Secondary | ICD-10-CM | POA: Diagnosis not present

## 2017-09-06 DIAGNOSIS — I951 Orthostatic hypotension: Secondary | ICD-10-CM | POA: Diagnosis not present

## 2017-09-06 DIAGNOSIS — S82832A Other fracture of upper and lower end of left fibula, initial encounter for closed fracture: Secondary | ICD-10-CM | POA: Diagnosis not present

## 2017-09-07 DIAGNOSIS — I951 Orthostatic hypotension: Secondary | ICD-10-CM | POA: Diagnosis not present

## 2017-09-07 DIAGNOSIS — S82892A Other fracture of left lower leg, initial encounter for closed fracture: Secondary | ICD-10-CM | POA: Diagnosis not present

## 2017-09-07 DIAGNOSIS — R509 Fever, unspecified: Secondary | ICD-10-CM | POA: Diagnosis not present

## 2017-09-07 DIAGNOSIS — S82832A Other fracture of upper and lower end of left fibula, initial encounter for closed fracture: Secondary | ICD-10-CM | POA: Diagnosis not present

## 2017-09-07 DIAGNOSIS — R55 Syncope and collapse: Secondary | ICD-10-CM | POA: Diagnosis not present

## 2017-09-08 DIAGNOSIS — J101 Influenza due to other identified influenza virus with other respiratory manifestations: Secondary | ICD-10-CM | POA: Insufficient documentation

## 2017-09-08 DIAGNOSIS — R55 Syncope and collapse: Secondary | ICD-10-CM | POA: Diagnosis not present

## 2017-09-08 DIAGNOSIS — I951 Orthostatic hypotension: Secondary | ICD-10-CM | POA: Diagnosis not present

## 2017-09-08 DIAGNOSIS — S82892A Other fracture of left lower leg, initial encounter for closed fracture: Secondary | ICD-10-CM | POA: Diagnosis not present

## 2017-09-08 DIAGNOSIS — S82832A Other fracture of upper and lower end of left fibula, initial encounter for closed fracture: Secondary | ICD-10-CM | POA: Diagnosis not present

## 2017-09-09 DIAGNOSIS — S82892A Other fracture of left lower leg, initial encounter for closed fracture: Secondary | ICD-10-CM | POA: Diagnosis not present

## 2017-09-09 DIAGNOSIS — R55 Syncope and collapse: Secondary | ICD-10-CM | POA: Diagnosis not present

## 2017-09-09 DIAGNOSIS — S82832A Other fracture of upper and lower end of left fibula, initial encounter for closed fracture: Secondary | ICD-10-CM | POA: Diagnosis not present

## 2017-09-09 DIAGNOSIS — I951 Orthostatic hypotension: Secondary | ICD-10-CM | POA: Diagnosis not present

## 2017-09-10 DIAGNOSIS — R55 Syncope and collapse: Secondary | ICD-10-CM | POA: Diagnosis not present

## 2017-09-10 DIAGNOSIS — I951 Orthostatic hypotension: Secondary | ICD-10-CM | POA: Diagnosis not present

## 2017-09-11 DIAGNOSIS — S82832D Other fracture of upper and lower end of left fibula, subsequent encounter for closed fracture with routine healing: Secondary | ICD-10-CM | POA: Diagnosis not present

## 2017-09-11 DIAGNOSIS — Z7982 Long term (current) use of aspirin: Secondary | ICD-10-CM | POA: Diagnosis not present

## 2017-09-11 DIAGNOSIS — E785 Hyperlipidemia, unspecified: Secondary | ICD-10-CM | POA: Diagnosis not present

## 2017-09-11 DIAGNOSIS — S9302XD Subluxation of left ankle joint, subsequent encounter: Secondary | ICD-10-CM | POA: Diagnosis not present

## 2017-09-11 DIAGNOSIS — Z7952 Long term (current) use of systemic steroids: Secondary | ICD-10-CM | POA: Diagnosis not present

## 2017-09-11 DIAGNOSIS — Z6827 Body mass index (BMI) 27.0-27.9, adult: Secondary | ICD-10-CM | POA: Diagnosis not present

## 2017-09-11 DIAGNOSIS — J101 Influenza due to other identified influenza virus with other respiratory manifestations: Secondary | ICD-10-CM | POA: Diagnosis not present

## 2017-09-11 DIAGNOSIS — R55 Syncope and collapse: Secondary | ICD-10-CM | POA: Diagnosis not present

## 2017-09-11 DIAGNOSIS — E669 Obesity, unspecified: Secondary | ICD-10-CM | POA: Diagnosis not present

## 2017-09-11 DIAGNOSIS — S82842D Displaced bimalleolar fracture of left lower leg, subsequent encounter for closed fracture with routine healing: Secondary | ICD-10-CM | POA: Diagnosis not present

## 2017-09-11 DIAGNOSIS — Z79891 Long term (current) use of opiate analgesic: Secondary | ICD-10-CM | POA: Diagnosis not present

## 2017-09-11 DIAGNOSIS — I951 Orthostatic hypotension: Secondary | ICD-10-CM | POA: Diagnosis not present

## 2017-09-12 ENCOUNTER — Encounter: Payer: Self-pay | Admitting: Family Medicine

## 2017-09-12 ENCOUNTER — Ambulatory Visit (INDEPENDENT_AMBULATORY_CARE_PROVIDER_SITE_OTHER): Payer: BLUE CROSS/BLUE SHIELD | Admitting: Family Medicine

## 2017-09-12 VITALS — BP 112/72 | HR 77 | Temp 97.9°F | Ht 75.0 in

## 2017-09-12 DIAGNOSIS — S82892D Other fracture of left lower leg, subsequent encounter for closed fracture with routine healing: Secondary | ICD-10-CM | POA: Diagnosis not present

## 2017-09-12 DIAGNOSIS — R55 Syncope and collapse: Secondary | ICD-10-CM

## 2017-09-12 DIAGNOSIS — J101 Influenza due to other identified influenza virus with other respiratory manifestations: Secondary | ICD-10-CM | POA: Diagnosis not present

## 2017-09-12 DIAGNOSIS — I951 Orthostatic hypotension: Secondary | ICD-10-CM | POA: Diagnosis not present

## 2017-09-12 LAB — TSH: TSH: 0.87 u[IU]/mL (ref 0.35–4.50)

## 2017-09-12 LAB — BASIC METABOLIC PANEL
BUN: 19 mg/dL (ref 6–23)
CALCIUM: 9.2 mg/dL (ref 8.4–10.5)
CO2: 33 mEq/L — ABNORMAL HIGH (ref 19–32)
CREATININE: 0.94 mg/dL (ref 0.40–1.50)
Chloride: 107 mEq/L (ref 96–112)
GFR: 87.08 mL/min (ref >60.00)
GLUCOSE: 138 mg/dL — AB (ref 70–99)
Potassium: 4 mEq/L (ref 3.5–5.1)
Sodium: 145 mEq/L (ref 135–145)

## 2017-09-12 LAB — VITAMIN B12: Vitamin B-12: 720 pg/mL (ref 211–911)

## 2017-09-12 LAB — CORTISOL: Cortisol, Plasma: 9.9 ug/dL

## 2017-09-12 LAB — T4, FREE: Free T4: 0.9 ng/dL (ref 0.60–1.60)

## 2017-09-12 MED ORDER — IPRATROPIUM-ALBUTEROL 0.5-2.5 (3) MG/3ML IN SOLN
3.00 | RESPIRATORY_TRACT | Status: DC
Start: ? — End: 2017-09-12

## 2017-09-12 MED ORDER — GENERIC EXTERNAL MEDICATION
Status: DC
Start: ? — End: 2017-09-12

## 2017-09-12 MED ORDER — NITROGLYCERIN 0.4 MG SL SUBL
0.40 | SUBLINGUAL_TABLET | SUBLINGUAL | Status: DC
Start: ? — End: 2017-09-12

## 2017-09-12 MED ORDER — ALBUTEROL SULFATE (2.5 MG/3ML) 0.083% IN NEBU
2.50 | INHALATION_SOLUTION | RESPIRATORY_TRACT | Status: DC
Start: ? — End: 2017-09-12

## 2017-09-12 MED ORDER — MIDODRINE HCL 5 MG PO TABS
5.00 | ORAL_TABLET | ORAL | Status: DC
Start: 2017-09-10 — End: 2017-09-12

## 2017-09-12 MED ORDER — OSELTAMIVIR PHOSPHATE 75 MG PO CAPS
75.00 | ORAL_CAPSULE | ORAL | Status: DC
Start: 2017-09-10 — End: 2017-09-12

## 2017-09-12 MED ORDER — CLOTRIMAZOLE-BETAMETHASONE 1-0.05 % EX CREA
TOPICAL_CREAM | CUTANEOUS | Status: DC
Start: 2017-09-10 — End: 2017-09-12

## 2017-09-12 MED ORDER — ACETAMINOPHEN 325 MG PO TABS
650.00 | ORAL_TABLET | ORAL | Status: DC
Start: ? — End: 2017-09-12

## 2017-09-12 MED ORDER — SODIUM CHLORIDE 0.9 % IV SOLN
INTRAVENOUS | Status: DC
Start: ? — End: 2017-09-12

## 2017-09-12 MED ORDER — GENERIC EXTERNAL MEDICATION
0.08 | Status: DC
Start: ? — End: 2017-09-12

## 2017-09-12 MED ORDER — HYDROCODONE-ACETAMINOPHEN 5-325 MG PO TABS
ORAL_TABLET | ORAL | Status: DC
Start: ? — End: 2017-09-12

## 2017-09-12 MED ORDER — FLUDROCORTISONE ACETATE 0.1 MG PO TABS
ORAL_TABLET | ORAL | Status: DC
Start: 2017-09-11 — End: 2017-09-12

## 2017-09-12 MED ORDER — GENERIC EXTERNAL MEDICATION
.04 | Status: DC
Start: ? — End: 2017-09-12

## 2017-09-12 MED ORDER — POLYETHYLENE GLYCOL 3350 17 G PO PACK
17.00 | PACK | ORAL | Status: DC
Start: ? — End: 2017-09-12

## 2017-09-12 MED ORDER — MECLIZINE HCL 12.5 MG PO TABS
12.50 | ORAL_TABLET | ORAL | Status: DC
Start: ? — End: 2017-09-12

## 2017-09-12 MED ORDER — ANTACID & ANTIGAS 200-200-20 MG/5ML PO SUSP
30.00 | ORAL | Status: DC
Start: ? — End: 2017-09-12

## 2017-09-12 MED ORDER — ENOXAPARIN SODIUM 40 MG/0.4ML ~~LOC~~ SOLN
40.00 | SUBCUTANEOUS | Status: DC
Start: 2017-09-10 — End: 2017-09-12

## 2017-09-12 MED ORDER — HYDROCODONE-ACETAMINOPHEN 10-325 MG PO TABS
ORAL_TABLET | ORAL | Status: DC
Start: ? — End: 2017-09-12

## 2017-09-12 NOTE — Progress Notes (Signed)
OFFICE VISIT  09/12/2017   CC:  Chief Complaint  Patient presents with  . Hospitalization Follow-up    HPI:    Patient is a 62 y.o.  male who presents accompanied by his wife for f/u syncope, was hospitalized with [facility] in [place] 3/19-2/27, was given dx of significant orthostatic hypotension I talked to his discharging MD, Dr. Benay Pike, on his day of d/c 2 d/a plus I have reviewed some records from his hospitalization today. He was discharged home on florinef and midodrine.  Suspected autonomic dysfunction, possibly related to a large amount of purposeful wt loss over the last 6 mo or so.  He fell during syncopal event and broke L ankle, got surgery next day.  Pt states he had syncopal episode 3d prior to admission ---unclear trigger, unclear if he felt any preceding lightheadedness.  Hurt top of his left foot. Next day had similar episode that occurred after he urinated.  He had gotten up from bed, went into bathroom, and urinated.  then after finishing urinating he felt lightheaded for a very brief moment then recalls waking up on floor..  No straining to urinate.   Hit left foot again. Next day, got lightheaded in shower and then passed out again, this time breaking his left ankle--he presented to the ED and was admitted that day.  Got surgery on ankle the next day. Eval for syncope was extensive: per pt report (no records available yet) echocardiogram, EKG, carotids u/s, CXR, CT of head, blood work/cardiac enzymes---all normal. Orthostatics were dramatically positive but NOT symptomatic.  Of note, pt also developed flu A in hosp and is currently finishing tamiflu.  Currently feels good other than cast on L leg.  In hospital he had no similar dizziness, no presyncope or syncope.  Also, he has had no significant episodes of dizziness or syncope AFTER going home.   Denies any side effect from the midodrine or florinef.  Prior to his episodes about 10 d ago, he says he felt very  well---absolutely no dizziness at all. As noted above, he has recently lost 80-90 lbs over the last 5-6 months purposefully.    Past Medical History:  Diagnosis Date  . Borderline hyperlipidemia 2018   [facility]  . Colon polyps approx age 3-48   Initial screening: Pt doesn't recall whether adenomatous or hyperplastic.  2014-adenomatous (recall 04/2018)  . Obesity, Class II, BMI 35-39.9     Past Surgical History:  Procedure Laterality Date  . COLONOSCOPY W/ POLYPECTOMY  approx 2006; 04/2013   04/2013 adenomatous--recall 5 yrs  . HEMORRHOID SURGERY  in his 26s  . TONSILLECTOMY AND ADENOIDECTOMY  1967    Outpatient Medications Prior to Visit  Medication Sig Dispense Refill  . fludrocortisone (FLORINEF) 0.1 MG tablet Take 2 tablets by mouth daily.    . midodrine (PROAMATINE) 5 MG tablet Take 1 tablet by mouth 3 (three) times daily.    . Multiple Vitamins-Minerals (CENTRUM SILVER PO) Take 1 tablet by mouth daily.       No facility-administered medications prior to visit.     No Known Allergies  ROS Review of Systems  Constitutional: Negative for fatigue and fever.  HENT: Negative for congestion and sore throat.   Eyes: Negative for visual disturbance.  Respiratory: Negative for cough and shortness of breath.   Cardiovascular: Negative for chest pain, palpitations and leg swelling.  Gastrointestinal: Negative for abdominal pain, blood in stool and nausea.  Endocrine: Negative for polydipsia, polyphagia and polyuria.  Genitourinary: Negative  for dysuria, frequency and urgency.  Musculoskeletal: Negative for back pain and joint swelling.  Skin: Negative for rash.  Neurological: Negative for tremors, speech difficulty, weakness, numbness and headaches.  Hematological: Negative for adenopathy.  Psychiatric/Behavioral: Negative for dysphoric mood. The patient is not nervous/anxious.      PE:BP sitting in WC 104/68, P 77.  BP standing 112/72, P88 Blood pressure 112/72, pulse 77,  temperature 97.9 F (36.6 C), temperature source Oral, height 6\' 3"  (1.905 m), SpO2 99 %. Gen: Alert, well appearing.  Patient is oriented to person, place, time, and situation. AFFECT: pleasant, lucid thought and speech. Sitting in WC, cast on L ankle/foot--ordered to do strict non-wt bearing by his ortho MD. CV: RRR, no m/r/g.   LUNGS: CTA bilat, nonlabored resps, good aeration in all lung fields.   LABS:  Lab Results  Component Value Date   TSH 1.64 02/20/2017   Lab Results  Component Value Date   WBC 7.7 02/20/2017   HGB 15.4 02/20/2017   HCT 46.8 02/20/2017   MCV 89.9 02/20/2017   PLT 250.0 02/20/2017   Lab Results  Component Value Date   CREATININE 1.21 02/20/2017   BUN 15 02/20/2017   NA 142 02/20/2017   K 4.2 02/20/2017   CL 105 02/20/2017   CO2 31 02/20/2017   Lab Results  Component Value Date   ALT 18 02/20/2017   AST 18 02/20/2017   ALKPHOS 73 02/20/2017   BILITOT 0.8 02/20/2017   Lab Results  Component Value Date   CHOL 193 02/20/2017   Lab Results  Component Value Date   HDL 57.00 02/20/2017   Lab Results  Component Value Date   LDLCALC 118 (H) 02/20/2017   Lab Results  Component Value Date   TRIG 89.0 02/20/2017   Lab Results  Component Value Date   CHOLHDL 3 02/20/2017   Lab Results  Component Value Date   PSA 1.29 02/20/2017   PSA 0.88 03/08/2016   PSA 1.18 03/09/2015   IMPRESSION AND PLAN:  1) Syncope, orthostatic hypotension dx'd while in hospital. Autonomic dysfunction--? Related to recent dramatic purposeful weight loss? He is significantly improved on midodrine and florinef. Will check for possibility of adrenal insufficiency with cortisol level today, although it is early afternoon. If it comes back low normal or low, will go ahead and check pituitary hormones. Also, check BMET, TSH, Vit B12, and Vit B1 today.  Referral to Dr. Caryl Comes with [facility] cardiology/electrophysiology for consideration of tilt table testing for  potential confirmation of dx and further recommendations on management going forward.  2) Left ankle fracture: f/u with ortho as planned.  3) Influenza A--developed while in hospital: finish tamiflu. This has clinically resolved.  Spent 40 min with pt today, with >50% of this time spent in counseling and care coordination regarding the above problems.  An After Visit Summary was printed and given to the patient.  FOLLOW UP: Return for make appt in 2 wks IF cardiology cannot see you within 1 mo.  Signed:  Crissie Sickles, MD           09/12/2017

## 2017-09-16 ENCOUNTER — Encounter: Payer: Self-pay | Admitting: Family Medicine

## 2017-09-16 LAB — T3: T3, Total: 107 ng/dL (ref 76–181)

## 2017-09-16 LAB — VITAMIN B1

## 2017-09-18 ENCOUNTER — Telehealth: Payer: Self-pay | Admitting: Family Medicine

## 2017-09-18 ENCOUNTER — Encounter: Payer: Self-pay | Admitting: Internal Medicine

## 2017-09-18 ENCOUNTER — Ambulatory Visit (INDEPENDENT_AMBULATORY_CARE_PROVIDER_SITE_OTHER): Payer: BLUE CROSS/BLUE SHIELD | Admitting: Internal Medicine

## 2017-09-18 VITALS — BP 109/69 | HR 63 | Ht 75.0 in | Wt 227.0 lb

## 2017-09-18 DIAGNOSIS — R52 Pain, unspecified: Secondary | ICD-10-CM | POA: Diagnosis not present

## 2017-09-18 DIAGNOSIS — S82892A Other fracture of left lower leg, initial encounter for closed fracture: Secondary | ICD-10-CM | POA: Diagnosis not present

## 2017-09-18 DIAGNOSIS — I951 Orthostatic hypotension: Secondary | ICD-10-CM

## 2017-09-18 DIAGNOSIS — Z967 Presence of other bone and tendon implants: Secondary | ICD-10-CM | POA: Diagnosis not present

## 2017-09-18 DIAGNOSIS — Z8781 Personal history of (healed) traumatic fracture: Secondary | ICD-10-CM | POA: Diagnosis not present

## 2017-09-18 DIAGNOSIS — R55 Syncope and collapse: Secondary | ICD-10-CM

## 2017-09-18 MED ORDER — FLUDROCORTISONE ACETATE 0.1 MG PO TABS: 200.0000 ug | ORAL_TABLET | Freq: Every day | ORAL | 11 refills | Status: AC

## 2017-09-18 MED ORDER — ASPIRIN EC 325 MG PO TBEC: DELAYED_RELEASE_TABLET | ORAL | 0 refills | Status: DC

## 2017-09-18 NOTE — Telephone Encounter (Signed)
For what problem/diagnosis?

## 2017-09-18 NOTE — Progress Notes (Signed)
Electrophysiology Office Note   Date:  09/18/2017   ID:  VA BROADWELL, DOB January 17, 1956, MRN [id number]  PCP:  Tammi Sou, MD  Cardiologist:  none  Postural syncope   History of Present Illness: Raymond Griffith is a 62 y.o. male who presents today for electrophysiology evaluation.   He is referred by Dr Anitra Lauth for EP consultation regarding recent orthostasis and syncope.  The patient is a very healthy 62 yo WM.  He reports having no prior CV issues.  Of note, he did lose 80 lbs since November with intentional weight loss. He reports having syncope after morning void 09/02/17.  He had not felt well that am.  Reports "queaziness".  Did not have N/V/F/C or diarrhea however.  He collapsed with brief LOC but then felt better.  The following morning, he has a similar episode of syncope again in the morning after voiding.  This time he was evaluated by Cherrie Gauze.  He received IV Fluid and was felt to be dehydrated.  Unfortunately, the following day, he had his third episode of morning syncope.  He fractured his L foot.  Workup has included echo, carotid dopplers, and ekg which have been unremarkable.  Orthostatics were profoundly abnormal.  He was placed on midodrine and florinef and has done well since.  Of note, he was diagnosed with influenza A several days after these events. His only concern today is with resolving fracture.  Denies dizziness or further syncope. Previously quite active without any exertional symptoms.  Today, he denies symptoms of palpitations, chest pain, shortness of breath, orthopnea, PND, lower extremity edema, claudication, dizziness, presyncope, syncope, bleeding, or neurologic sequela. The patient is tolerating medications without difficulties and is otherwise without complaint today.    Past Medical History:  Diagnosis Date  . Borderline hyperlipidemia 2018   TLC  . Colon polyps approx age 30-48   Initial screening: Pt doesn't recall whether  adenomatous or hyperplastic.  2014-adenomatous (recall 04/2018)  . Obesity, Class II, BMI 35-39.9   . Syncope 08/2017   neurocardiogenic suspected: referred to Dr. Caryl Comes for consideration of tilt table testing 08/2017.   Past Surgical History:  Procedure Laterality Date  . COLONOSCOPY W/ POLYPECTOMY  approx 2006; 04/2013   04/2013 adenomatous--recall 5 yrs  . HEMORRHOID SURGERY  in his 74s  . TONSILLECTOMY AND ADENOIDECTOMY  1967     Current Outpatient Medications  Medication Sig Dispense Refill  . aspirin 325 MG tablet Take 325 mg by mouth daily.    . fludrocortisone (FLORINEF) 0.1 MG tablet Take 2 tablets by mouth daily.    . midodrine (PROAMATINE) 5 MG tablet Take 1 tablet by mouth 3 (three) times daily.    . Multiple Vitamins-Minerals (CENTRUM SILVER PO) Take 1 tablet by mouth daily.      Marland Kitchen oxycodone (OXY-IR) 5 MG capsule Take 5 mg by mouth every 4 (four) hours as needed for pain.     No current facility-administered medications for this visit.     Allergies:   Patient has no known allergies.   Social History:  The patient  reports that he has never smoked. He has never used smokeless tobacco. He reports that he drinks about 1.8 oz of alcohol per week. He reports that he does not use drugs.   Family History:  The patient's  family history includes Cancer in his paternal grandmother; Diabetes in his father and mother.    ROS:  Please see the history of present illness.  All other systems are personally reviewed and negative.    PHYSICAL EXAM: VS:  BP 109/69   Pulse 63   Ht 6\' 3"  (1.905 m)   Wt 103 kg (227 lb)   BMI 28.37 kg/m  , BMI Body mass index is 28.37 kg/m. GEN: Well nourished, well developed, in no acute distress  HEENT: normal  Neck: no JVD, carotid bruits, or masses Cardiac: RRR; no murmurs, rubs, or gallops,no edema  Respiratory:  clear to auscultation bilaterally, normal work of breathing GI: soft, nontender, nondistended, + BS MS: no deformity or  atrophy , L foot in a cast Skin: warm and dry  Neuro:  Strength and sensation are intact Psych: euthymic mood, full affect  EKG:  EKG tracing from 09/03/17 is reviewed and reveals sinus rhythm, normal ekg   Recent Labs: 02/20/2017: ALT 18; Hemoglobin 15.4; Platelets 250.0 09/12/2017: BUN 19; Creatinine, Ser 0.94; Potassium 4.0; Sodium 145; TSH 0.87  personally reviewed   Lipid Panel     Component Value Date/Time   CHOL 193 02/20/2017 1618   TRIG 89.0 02/20/2017 1618   HDL 57.00 02/20/2017 1618   CHOLHDL 3 02/20/2017 1618   VLDL 17.8 02/20/2017 1618   LDLCALC 118 (H) 02/20/2017 1618   LDLDIRECT 141.5 02/13/2013 0912   personally reviewed   Wt Readings from Last 3 Encounters:  09/18/17 103 kg (227 lb)  02/20/17 (!) 136.8 kg (301 lb 8 oz)  03/08/16 (!) 137.6 kg (303 lb 6.4 oz)      Other studies personally reviewed: Additional studies/ records that were reviewed today include: prior echo (normal in care everywhere) 50 pages of records from [place] of the above records today demonstrates: as above   ASSESSMENT AND PLAN:  1.  Orthostatic syncope Unclear etiology though likely due to recent diet and possibly influenza He is no longer orthostatic and symptoms have resolved. I will stop midodrine today Return in 4 weeks If still doing well and not orthostatic, would stop florinef then  He was discharged on ASA 325mg  BID for reasons not clear to me. I will reduce ASA to 325mg  daily today.  In 5 days, reduce to 81mg  daily.  In 4 weeks, stop ASA   2. Obesity Body mass index is 28.37 kg/m. 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   The patient does  not have concerns regarding his medicines.  The following changes were made today:  none    Signed, Thompson Grayer, MD  09/18/2017 10:01 AM     [facility] [address]  [place] [address] [phone] (office) [phone] (fax)

## 2017-09-18 NOTE — Telephone Encounter (Signed)
Noted. Raymond Griffith was able to get [facility] nurse the correct phone # for pt's orthopedist.

## 2017-09-18 NOTE — Telephone Encounter (Signed)
Spoke to [place] and she states it is for his fractured ankle.  She states she can not get in touch of Dr Juleen China.  Home health is just to make sure it is healing properly and there are no complications.    Estill Bamberg states that she can not get in touch with Dr Alcario Drought office. ( number disconnected )

## 2017-09-18 NOTE — Patient Instructions (Addendum)
Medication Instructions:  Your physician has recommended you make the following change in your medication:  1.  Decrease your aspirin to 325 mg one tablet daily for 5 days. 2.  After 5 days decrease to aspirin 81 mg one tablet daily. 3.  After taking aspirin 81 mg for 4 weeks -- discontinue aspirin 4.  Discontinue midodrine  Labwork: None ordered.  Testing/Procedures: None ordered.  Follow-Up: Your physician wants you to follow-up in: 4 weeks with Truitt Merle.    Any Other Special Instructions Will Be Listed Below (If Applicable).  If you need a refill on your cardiac medications before your next appointment, please call your pharmacy.

## 2017-09-18 NOTE — Telephone Encounter (Signed)
Copied from [place] [id number]. Topic: General - Other >> Sep 18, 2017  8:42 AM Darl Householder, RMA wrote: Reason for CRM: Estill Bamberg from [facility] is calling requesting verbal orders for home once a week  x3 weeks, please return call to [phone]

## 2017-09-18 NOTE — Telephone Encounter (Signed)
After talking with Home health nurse, I found a phone number that should work.  She will contact Dr Alcario Drought office in [place] for orders.

## 2017-09-19 ENCOUNTER — Encounter: Payer: Self-pay | Admitting: Family Medicine

## 2017-09-19 DIAGNOSIS — E669 Obesity, unspecified: Secondary | ICD-10-CM | POA: Diagnosis not present

## 2017-09-19 DIAGNOSIS — Z7982 Long term (current) use of aspirin: Secondary | ICD-10-CM | POA: Diagnosis not present

## 2017-09-19 DIAGNOSIS — R55 Syncope and collapse: Secondary | ICD-10-CM | POA: Diagnosis not present

## 2017-09-19 DIAGNOSIS — S9302XD Subluxation of left ankle joint, subsequent encounter: Secondary | ICD-10-CM | POA: Diagnosis not present

## 2017-09-19 DIAGNOSIS — Z6827 Body mass index (BMI) 27.0-27.9, adult: Secondary | ICD-10-CM | POA: Diagnosis not present

## 2017-09-19 DIAGNOSIS — I951 Orthostatic hypotension: Secondary | ICD-10-CM | POA: Diagnosis not present

## 2017-09-19 DIAGNOSIS — S82832D Other fracture of upper and lower end of left fibula, subsequent encounter for closed fracture with routine healing: Secondary | ICD-10-CM | POA: Diagnosis not present

## 2017-09-19 DIAGNOSIS — Z79891 Long term (current) use of opiate analgesic: Secondary | ICD-10-CM | POA: Diagnosis not present

## 2017-09-19 DIAGNOSIS — Z7952 Long term (current) use of systemic steroids: Secondary | ICD-10-CM | POA: Diagnosis not present

## 2017-09-19 DIAGNOSIS — S82842D Displaced bimalleolar fracture of left lower leg, subsequent encounter for closed fracture with routine healing: Secondary | ICD-10-CM | POA: Diagnosis not present

## 2017-09-19 DIAGNOSIS — J101 Influenza due to other identified influenza virus with other respiratory manifestations: Secondary | ICD-10-CM | POA: Diagnosis not present

## 2017-09-19 DIAGNOSIS — E785 Hyperlipidemia, unspecified: Secondary | ICD-10-CM | POA: Diagnosis not present

## 2017-09-20 DIAGNOSIS — S82832D Other fracture of upper and lower end of left fibula, subsequent encounter for closed fracture with routine healing: Secondary | ICD-10-CM | POA: Diagnosis not present

## 2017-09-20 DIAGNOSIS — I951 Orthostatic hypotension: Secondary | ICD-10-CM | POA: Diagnosis not present

## 2017-09-20 DIAGNOSIS — E785 Hyperlipidemia, unspecified: Secondary | ICD-10-CM | POA: Diagnosis not present

## 2017-09-20 DIAGNOSIS — Z7982 Long term (current) use of aspirin: Secondary | ICD-10-CM | POA: Diagnosis not present

## 2017-09-20 DIAGNOSIS — J101 Influenza due to other identified influenza virus with other respiratory manifestations: Secondary | ICD-10-CM | POA: Diagnosis not present

## 2017-09-20 DIAGNOSIS — Z79891 Long term (current) use of opiate analgesic: Secondary | ICD-10-CM | POA: Diagnosis not present

## 2017-09-20 DIAGNOSIS — S9302XD Subluxation of left ankle joint, subsequent encounter: Secondary | ICD-10-CM | POA: Diagnosis not present

## 2017-09-20 DIAGNOSIS — Z6827 Body mass index (BMI) 27.0-27.9, adult: Secondary | ICD-10-CM | POA: Diagnosis not present

## 2017-09-20 DIAGNOSIS — R55 Syncope and collapse: Secondary | ICD-10-CM | POA: Diagnosis not present

## 2017-09-20 DIAGNOSIS — E669 Obesity, unspecified: Secondary | ICD-10-CM | POA: Diagnosis not present

## 2017-09-20 DIAGNOSIS — Z7952 Long term (current) use of systemic steroids: Secondary | ICD-10-CM | POA: Diagnosis not present

## 2017-09-20 DIAGNOSIS — S82842D Displaced bimalleolar fracture of left lower leg, subsequent encounter for closed fracture with routine healing: Secondary | ICD-10-CM | POA: Diagnosis not present

## 2017-09-27 ENCOUNTER — Encounter: Payer: Self-pay | Admitting: Nurse Practitioner

## 2017-09-27 DIAGNOSIS — Z7952 Long term (current) use of systemic steroids: Secondary | ICD-10-CM | POA: Diagnosis not present

## 2017-09-27 DIAGNOSIS — S9302XD Subluxation of left ankle joint, subsequent encounter: Secondary | ICD-10-CM | POA: Diagnosis not present

## 2017-09-27 DIAGNOSIS — E669 Obesity, unspecified: Secondary | ICD-10-CM | POA: Diagnosis not present

## 2017-09-27 DIAGNOSIS — I951 Orthostatic hypotension: Secondary | ICD-10-CM | POA: Diagnosis not present

## 2017-09-27 DIAGNOSIS — S82842D Displaced bimalleolar fracture of left lower leg, subsequent encounter for closed fracture with routine healing: Secondary | ICD-10-CM | POA: Diagnosis not present

## 2017-09-27 DIAGNOSIS — S82832D Other fracture of upper and lower end of left fibula, subsequent encounter for closed fracture with routine healing: Secondary | ICD-10-CM | POA: Diagnosis not present

## 2017-09-27 DIAGNOSIS — Z6827 Body mass index (BMI) 27.0-27.9, adult: Secondary | ICD-10-CM | POA: Diagnosis not present

## 2017-09-27 DIAGNOSIS — R55 Syncope and collapse: Secondary | ICD-10-CM | POA: Diagnosis not present

## 2017-09-27 DIAGNOSIS — J101 Influenza due to other identified influenza virus with other respiratory manifestations: Secondary | ICD-10-CM | POA: Diagnosis not present

## 2017-09-27 DIAGNOSIS — Z79891 Long term (current) use of opiate analgesic: Secondary | ICD-10-CM | POA: Diagnosis not present

## 2017-09-27 DIAGNOSIS — E785 Hyperlipidemia, unspecified: Secondary | ICD-10-CM | POA: Diagnosis not present

## 2017-09-27 DIAGNOSIS — Z7982 Long term (current) use of aspirin: Secondary | ICD-10-CM | POA: Diagnosis not present

## 2017-10-02 DIAGNOSIS — Z967 Presence of other bone and tendon implants: Secondary | ICD-10-CM | POA: Diagnosis not present

## 2017-10-02 DIAGNOSIS — R52 Pain, unspecified: Secondary | ICD-10-CM | POA: Diagnosis not present

## 2017-10-02 DIAGNOSIS — Z8781 Personal history of (healed) traumatic fracture: Secondary | ICD-10-CM | POA: Diagnosis not present

## 2017-10-07 ENCOUNTER — Ambulatory Visit (INDEPENDENT_AMBULATORY_CARE_PROVIDER_SITE_OTHER): Payer: BLUE CROSS/BLUE SHIELD | Admitting: Nurse Practitioner

## 2017-10-07 ENCOUNTER — Encounter: Payer: Self-pay | Admitting: Nurse Practitioner

## 2017-10-07 VITALS — BP 120/78 | HR 68 | Ht 76.0 in | Wt 230.8 lb

## 2017-10-07 DIAGNOSIS — R55 Syncope and collapse: Secondary | ICD-10-CM

## 2017-10-07 DIAGNOSIS — I951 Orthostatic hypotension: Secondary | ICD-10-CM

## 2017-10-07 NOTE — Patient Instructions (Signed)
We will be checking the following labs today - NONE   Medication Instructions:    Continue with your current medicines.   Stop baby aspirin when your boot comes off  Decrease Florinef to one pill a day - when you come back from vacation - stop altogether    Testing/Procedures To Be Arranged:  N/A  Follow-Up:   See [place] back as needed    Other Special Instructions:   Monitor your BP as you cut the Florinef back and after you stop it.     If you need a refill on your cardiac medications before your next appointment, please call your pharmacy.   Call the [place] office at [phone] if you have any questions, problems or concerns.

## 2017-10-07 NOTE — Progress Notes (Signed)
CARDIOLOGY OFFICE NOTE  Date:  10/07/2017    Raymond Griffith Date of Birth: 05-09-1956 Medical Record #[id number]  PCP:  Raymond Sou, MD  Cardiologist:  Raymond Griffith   Chief Complaint  Patient presents with  . Loss of Consciousness    Follow up visit - seen for Dr. Rayann Griffith    History of Present Illness: Raymond Griffith is a 62 y.o. male who presents today for a follow up visit. Seen for Dr. Rayann Griffith.   He was referred here initially for EP consultation due to orthostasis & syncope. Healthy male with no prior issues. Has had intentional/significant weight loss of 80# since November. Had syncopal spell last month after voiding. Collapsed with brief LOC. Recurred the following morning after voiding - then evaluated by Raymond Griffith. Felt to be dehydrated - treated with IVF. He had a recurrent spell the following morning and fractured his left foot. He had echo, carotids and EKG - noted to be unremarkable. Very orthostatic - placed on midodrine and Florinef. Then diagnosed with influenza A several days after these syncopal spells.   He saw Dr. Rayann Griffith earlier this month - was no longer orthostatic. Feeling back to baseline.Midodrine was stopped.   Comes in today. Here alone.  He has done the "Ideal" weight loss plan. Has stopped since his spells of syncope. He says he feels fine. No more syncope. Feels like he is back to his baseline and has no complaint. Notes that the day before his first episode he had come home, "not feeling well" - went to bed and did not get up until the next morning - which was the first morning he passed out. He says he was going to a baby aspirin as of today. His foot continues to improve. He is back driving. Going out of the country for a week next week on vacation. Admits he is a little worried about not being on any medicine while out of the country and worried about recurrent syncope in general.   Past Medical History:  Diagnosis Date  . Borderline  hyperlipidemia 2018   TLC  . Colon polyps approx age 26-48   Initial screening: Pt doesn't recall whether adenomatous or hyperplastic.  2014-adenomatous (recall 04/2018)  . Obesity, Class II, BMI 35-39.9   . Syncope 08/2017   neurocardiogenic suspected: referred to EP MD for consideration of tilt table testing 08/2017--pt was doing well on midodrine and florinef at that time so no further testing was done.  Midodrine d/c'd and f/u 1 mo (?syncope related to recent wt loss or recent flu illness?)    Past Surgical History:  Procedure Laterality Date  . COLONOSCOPY W/ POLYPECTOMY  approx 2006; 04/2013   04/2013 adenomatous--recall 5 yrs  . HEMORRHOID SURGERY  in his 18s  . TONSILLECTOMY AND ADENOIDECTOMY  1967     Medications: Current Meds  Medication Sig  . aspirin EC 325 MG tablet Take daily x 5 days.  Then decrease to 81 mg aspirin daily x 4 weeks.  Then discontinue all aspirin.  . fludrocortisone (FLORINEF) 0.1 MG tablet Take 2 tablets (200 mcg total) by mouth daily.  . Multiple Vitamins-Minerals (CENTRUM SILVER PO) Take 1 tablet by mouth daily.       Allergies: No Known Allergies  Social History: The patient  reports that he has never smoked. He has never used smokeless tobacco. He reports that he drinks about 1.8 oz of alcohol per week. He reports that he does not use drugs.  Family History: The patient's family history includes Cancer in his paternal grandmother; Diabetes in his father and mother.   Review of Systems: Please see the history of present illness.   Otherwise, the review of systems is positive for none.   All other systems are reviewed and negative.   Physical Exam: VS:  BP 120/78 (BP Location: Left Arm, Patient Position: Sitting, Cuff Size: Normal)   Pulse 68   Ht 6\' 4"  (1.93 m)   Wt 230 lb 12.8 oz (104.7 kg)   BMI 28.09 kg/m  .  BMI Body mass index is 28.09 kg/m.  Wt Readings from Last 3 Encounters:  10/07/17 230 lb 12.8 oz (104.7 kg)  09/18/17 227  lb (103 kg)  02/20/17 (!) 301 lb 8 oz (136.8 kg)   Lying BP is 120/78 with HR 68 Sitting BP is 122/78 with HR 69 Standing BP is 132/82 with HR 82 Standing BP at 3 minutes is 134/84 with HR 76  General: Pleasant. Well developed, well nourished and in no acute distress.   HEENT: Normal.  Neck: Supple, no JVD, carotid bruits, or masses noted.  Cardiac: Regular rate and rhythm. No murmurs, rubs, or gallops. No edema.  Respiratory:  Lungs are clear to auscultation bilaterally with normal work of breathing.  GI: Soft and nontender.  MS: No deformity or atrophy. Gait and ROM intact.  Skin: Warm and dry. Color is normal.  Neuro:  Strength and sensation are intact and no gross focal deficits noted.  Psych: Alert, appropriate and with normal affect.   LABORATORY DATA:  EKG:  EKG is not ordered today.  Lab Results  Component Value Date   WBC 7.7 02/20/2017   HGB 15.4 02/20/2017   HCT 46.8 02/20/2017   PLT 250.0 02/20/2017   GLUCOSE 138 (H) 09/12/2017   CHOL 193 02/20/2017   TRIG 89.0 02/20/2017   HDL 57.00 02/20/2017   LDLDIRECT 141.5 02/13/2013   LDLCALC 118 (H) 02/20/2017   ALT 18 02/20/2017   AST 18 02/20/2017   NA 145 09/12/2017   K 4.0 09/12/2017   CL 107 09/12/2017   CREATININE 0.94 09/12/2017   BUN 19 09/12/2017   CO2 33 (H) 09/12/2017   TSH 0.87 09/12/2017   PSA 1.29 02/20/2017     BNP (last 3 results) No results for input(s): BNP in the last 8760 hours.  ProBNP (last 3 results) No results for input(s): PROBNP in the last 8760 hours.   Other Studies Reviewed Today:  ECHO from 08/2017 Interpretation Summary A complete portable two-dimensional transthoracic echocardiogram with color flow Doppler and Spectral Doppler was performed. The study was technically adequate. The left ventricle is normal in size, wall thickness and wall motion with ejection fraction of 55-60%. The left ventricular diastolic function is normal. Estimation of right ventricular systolic  pressure is not possible. The aortic valve is trileaflet with thin, pliable leaflets that move normally.  Left Ventricle The left ventricle is normal in size, wall thickness and wall motion with ejection fraction of 55-60%. The left ventricular diastolic function is normal.   Right Ventricle The right ventricle is normal in structure and function.  Atria The left atrium is normal size. The right atrium is normal.  Mitral Valve The mitral valve leaflets appear normal. There is no evidence of stenosis, fluttering, or prolapse.   Tricuspid Valve The tricuspid valve leaflets are thin and pliable and the valve motion is normal. Estimation of right ventricular systolic pressure is not possible. There is trace tricuspid  regurgitation.  Aortic Valve The aortic valve is trileaflet with thin, pliable leaflets that move normally.  Pulmonic Valve The pulmonic valve leaflets are thin and pliable; valve motion is normal.  Vessels The aortic root is normal in diameter.  Pericardium There is no pericardial effusion.  MMode/2D Measurements & Calculations IVSd: 0.99 cm LVIDd: 5.1 cm LVIDs: 3.6 cm LVPWd: 1.1 cm  _____________________________________________________________ LV mass(C)d: 196.7 gramsAo root diam: 3.1 cm  LV mass(C)dI: 83.6 grams/m2 Ao root area: 7.5 cm2 LA dimension: 4.1 cm _____________________________________________________________  LAV (MOD-bp) Index: 15.4 ml/m2LAV(MOD-bp): 36.1 ml  Doppler Measurements & Calculations MV E max vel: 72.8 cm/sec MV A max vel: 55.8 cm/sec MV E/A: 1.3   ____________________________________________________________________________   Lamar Blinks, M.D.,F.A.C.C. on  09/05/2017 Electronically signed by:12:51 PM Ordering Physician: [id number]^^^^^    Carotid doppler 08/2017 IMPRESSION: 1.No hemodynamically significant stenosis on either side. 2.Both vertebral arteries are patent with antegrade flow.  Stenosis is determined by [facility] criteria, which is ICAVL accredited and internally validated.  Electronically Signed by: Mickeal Skinner     Assessment/Plan:  1.  Orthostatic syncope - probably due to flu/dehydration in the setting of prior massive weight loss - doing very well. He is not orthostatic. Will cut the Florinef to just one pill - he will stop altogether when he comes back from vacation. Stop aspirin when he comes out of his boot. See back as needed. I have asked him to try and monitor his BP as we continue to wean his medicines.   2. Obesity - intentional weight loss. .   3. Foot fracture -per ortho  Current medicines are reviewed with the patient today.  The patient does not have concerns regarding medicines other than what has been noted above.  The following changes have been made:  See above.  Labs/ tests ordered today include:   No orders of the defined types were placed in this encounter.    Disposition:   FU prn.    Patient is agreeable to this plan and will call if any problems develop in the interim.   SignedTruitt Merle, NP  10/07/2017 12:18 PM  [facility] [address] [place], [place]  [address] Phone: [phone] Fax: [phone]

## 2017-10-23 DIAGNOSIS — R52 Pain, unspecified: Secondary | ICD-10-CM | POA: Diagnosis not present

## 2017-10-23 DIAGNOSIS — Z8781 Personal history of (healed) traumatic fracture: Secondary | ICD-10-CM | POA: Diagnosis not present

## 2017-10-23 DIAGNOSIS — Z967 Presence of other bone and tendon implants: Secondary | ICD-10-CM | POA: Diagnosis not present

## 2018-05-12 DIAGNOSIS — H43811 Vitreous degeneration, right eye: Secondary | ICD-10-CM | POA: Diagnosis not present

## 2018-06-08 ENCOUNTER — Encounter: Payer: Self-pay | Admitting: Internal Medicine

## 2019-03-03 DIAGNOSIS — B029 Zoster without complications: Secondary | ICD-10-CM | POA: Diagnosis not present

## 2019-05-11 DIAGNOSIS — H5201 Hypermetropia, right eye: Secondary | ICD-10-CM | POA: Diagnosis not present

## 2019-05-11 DIAGNOSIS — H35362 Drusen (degenerative) of macula, left eye: Secondary | ICD-10-CM | POA: Diagnosis not present

## 2019-09-09 LAB — HEPATIC FUNCTION PANEL
ALT: 12 (ref 10–40)
AST: 15 (ref 14–40)
Bilirubin, Total: 0.5

## 2019-09-09 LAB — BASIC METABOLIC PANEL
BUN: 23 — AB (ref 4–21)
CO2: 28 — AB (ref 13–22)
Chloride: 107 (ref 99–108)
Creatinine: 1.2 (ref 0.6–1.3)
Glucose: 111
Potassium: 4.8 (ref 3.4–5.3)
Sodium: 142 (ref 137–147)

## 2019-09-09 LAB — LIPID PANEL
Cholesterol: 207 — AB (ref 0–200)
HDL: 65 (ref 35–70)
LDL Cholesterol: 123
Triglycerides: 93 (ref 40–160)

## 2019-09-09 LAB — HEMOGLOBIN A1C: Hemoglobin A1C: 5.5

## 2019-09-09 LAB — CBC AND DIFFERENTIAL
HCT: 45 (ref 41–53)
Hemoglobin: 15 (ref 13.5–17.5)
Neutrophils Absolute: 4343
Platelets: 236 (ref 150–399)
WBC: 6.6

## 2019-09-09 LAB — TSH: TSH: 2.21 (ref 0.41–5.90)

## 2019-09-09 LAB — PSA: PSA: 0.9

## 2019-09-09 LAB — CBC: RBC: 5.15 — AB (ref 3.87–5.11)

## 2019-09-09 LAB — COMPREHENSIVE METABOLIC PANEL
Albumin: 4.2 (ref 3.5–5.0)
Calcium: 9.5 (ref 8.7–10.7)
Globulin: 2.1

## 2019-09-09 LAB — VITAMIN B12: Vitamin B-12: 408

## 2019-09-09 LAB — VITAMIN D 25 HYDROXY (VIT D DEFICIENCY, FRACTURES): Vit D, 25-Hydroxy: 13

## 2019-09-16 ENCOUNTER — Encounter: Payer: Self-pay | Admitting: Family Medicine

## 2021-02-15 DIAGNOSIS — J40 Bronchitis, not specified as acute or chronic: Secondary | ICD-10-CM | POA: Diagnosis not present

## 2021-02-15 DIAGNOSIS — J329 Chronic sinusitis, unspecified: Secondary | ICD-10-CM | POA: Diagnosis not present

## 2021-02-15 DIAGNOSIS — R059 Cough, unspecified: Secondary | ICD-10-CM | POA: Diagnosis not present

## 2021-06-07 DIAGNOSIS — R051 Acute cough: Secondary | ICD-10-CM | POA: Diagnosis not present

## 2021-06-07 DIAGNOSIS — J209 Acute bronchitis, unspecified: Secondary | ICD-10-CM | POA: Diagnosis not present

## 2022-02-06 DIAGNOSIS — L821 Other seborrheic keratosis: Secondary | ICD-10-CM | POA: Diagnosis not present

## 2022-02-06 DIAGNOSIS — D2339 Other benign neoplasm of skin of other parts of face: Secondary | ICD-10-CM | POA: Diagnosis not present

## 2022-02-06 DIAGNOSIS — D2362 Other benign neoplasm of skin of left upper limb, including shoulder: Secondary | ICD-10-CM | POA: Diagnosis not present

## 2022-07-16 ENCOUNTER — Telehealth: Payer: Self-pay | Admitting: *Deleted

## 2022-07-16 NOTE — Patient Outreach (Signed)
  Care Coordination   07/16/2022 Name: Raymond Griffith MRN: [id number] DOB: 15-May-1956   Care Coordination Outreach Attempts:  An unsuccessful telephone outreach was attempted today to offer the patient information about available care coordination services as a benefit of their health plan.   Follow Up Plan:  Additional outreach attempts will be made to offer the patient care coordination information and services.   Encounter Outcome:  No Answer   Care Coordination Interventions:  No, not indicated    Raina Mina, RN Care Management Coordinator [place] Office [phone]

## 2023-05-29 ENCOUNTER — Encounter: Payer: Self-pay | Admitting: Family Medicine

## 2023-05-30 ENCOUNTER — Ambulatory Visit: Payer: Medicare Other | Admitting: Family Medicine

## 2023-05-30 ENCOUNTER — Encounter: Payer: Self-pay | Admitting: Family Medicine

## 2023-05-30 VITALS — BP 138/82 | HR 70 | Ht 75.0 in | Wt 300.2 lb

## 2023-05-30 DIAGNOSIS — Z125 Encounter for screening for malignant neoplasm of prostate: Secondary | ICD-10-CM

## 2023-05-30 DIAGNOSIS — Z13220 Encounter for screening for lipoid disorders: Secondary | ICD-10-CM | POA: Diagnosis not present

## 2023-05-30 DIAGNOSIS — Z0000 Encounter for general adult medical examination without abnormal findings: Secondary | ICD-10-CM | POA: Diagnosis not present

## 2023-05-30 DIAGNOSIS — Z1211 Encounter for screening for malignant neoplasm of colon: Secondary | ICD-10-CM

## 2023-05-30 NOTE — Progress Notes (Signed)
Office Note 05/30/2023  CC:  Chief Complaint  Patient presents with   Establish Care    No further questions/concerns. Referral for colonoscopy.     HPI:  Raymond Griffith is a 67 y.o.  male who is here to re- establish care, cpe. Patient's most recent primary MD: None. Old records in [facility] EMR were reviewed prior to or during today's visit.  Very stressed with work, works long hours.  Gets 5 to 6 hours of sleep nightly. Not exercising.  Says he does not eat healthy.  Otherwise no acute concerns.   Past Medical History:  Diagnosis Date   Ankle fracture, left    Bimalleolar 2019, ORIF   Borderline hyperlipidemia 2018   TLC   Colon polyps approx age 62-50   Initial screening: Pt doesn't recall whether adenomatous or hyperplastic.  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  Midodrine d/c'd and f/u 1 mo (?syncope related to recent wt loss or recent flu illness?)    Past Surgical History:  Procedure Laterality Date   COLONOSCOPY W/ POLYPECTOMY  approx 2006; 04/2013   04/2013 adenomatous--recall 5 yrs   HEMORRHOID SURGERY  in his 11s   ORIF ANKLE FRACTURE BIMALLEOLAR Left 2019   TONSILLECTOMY AND ADENOIDECTOMY  06/18/1965    Family History  Problem Relation Age of Onset   Diabetes Mother    Cancer Father    Cancer Maternal Grandmother    Colon cancer Neg Hx     Social History   Socioeconomic History   Marital status: Married    Spouse name: [name]   Number of children: Not on file   Years of education: Not on file   Highest education level: Not on file  Occupational History    Employer: [employer]  Tobacco Use   Smoking status: Never   Smokeless tobacco: Never  Vaping Use   Vaping status: Never Used  Substance and  Sexual Activity   Alcohol use: Yes    Alcohol/week: 3.0 standard drinks of alcohol    Types: 3 Cans of beer per week   Drug use: No   Sexual activity: Yes    Partners: Female  Other Topics Concern   Not on file  Social History Narrative   Married, one adult son.   Originally from [place] but went to [employer], got accounting degree.   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   Exercises in spurts only.  No T/A/Ds.   Lives in [place] in [place]   Social Drivers of Health   Financial Resource Strain: Not on file  Food Insecurity: Not on file  Transportation Needs: Not on file  Physical Activity: Not on file  Stress: Not on file  Social Connections: Not on file  Intimate Partner Violence: Not on file    Outpatient Encounter Medications as of 05/30/2023  Medication Sig   aspirin EC 325 MG tablet Take daily x 5 days.  Then decrease to 81 mg aspirin daily x 4 weeks.  Then discontinue all aspirin.   Multiple Vitamins-Minerals (CENTRUM SILVER PO) Take 1 tablet by mouth daily.     No facility-administered encounter medications on file as of 05/30/2023.    No Known Allergies  Review of Systems  Constitutional:  Negative for appetite change, chills, fatigue and fever.  HENT:  Negative for congestion, dental problem, ear pain and sore throat.   Eyes:  Negative for discharge, redness and visual disturbance.  Respiratory:  Negative for cough, chest tightness, shortness of breath and wheezing.   Cardiovascular:  Negative for chest pain, palpitations and leg swelling.  Gastrointestinal:  Negative for abdominal pain, blood in stool, diarrhea, nausea and vomiting.  Genitourinary:  Negative for difficulty urinating, dysuria, flank pain, frequency, hematuria and urgency.  Musculoskeletal:  Negative for arthralgias, back pain, joint swelling, myalgias and neck stiffness.  Skin:  Negative for pallor and rash.  Neurological:  Negative for dizziness, speech difficulty,  weakness and headaches.  Hematological:  Negative for adenopathy. Does not bruise/bleed easily.  Psychiatric/Behavioral:  Negative for confusion and sleep disturbance. The patient is not nervous/anxious.     PE; Blood pressure 138/82, pulse 70, height 6\' 3"  (1.905 m), weight (!) 300 lb 3.2 oz (136.2 kg), SpO2 97%. Body mass index is 37.52 kg/m.  Physical Exam  Gen: Alert, well appearing.  Patient is oriented to person, place, time, and situation. AFFECT: pleasant, lucid thought and speech. ENT: Ears: EACs clear, normal epithelium.  TMs with good light reflex and landmarks bilaterally.  Eyes: no injection, icteris, swelling, or exudate.  EOMI, PERRLA. Nose: no drainage or turbinate edema/swelling.  No injection or focal lesion.  Mouth: lips without lesion/swelling.  Oral mucosa pink and moist.  Dentition intact and without obvious caries or gingival swelling.  Oropharynx without erythema, exudate, or swelling.  Neck: supple/nontender.  No LAD, mass, or TM.  Carotid pulses 2+ bilaterally, without bruits. CV: RRR, no m/r/g.   LUNGS: CTA bilat, nonlabored resps, good aeration in all lung fields. ABD: soft, NT, ND, BS normal.  No hepatospenomegaly or mass.  No bruits. EXT: no clubbing, cyanosis, or edema.  Musculoskeletal: no joint swelling, erythema, warmth, or tenderness.  ROM of all joints intact. Skin - no sores or suspicious lesions or rashes or color changes  Pertinent labs:  Last CBC Lab Results  Component Value Date   WBC 6.6 09/09/2019   HGB 15.0 09/09/2019   HCT 45 09/09/2019   MCV 89.9 02/20/2017   RDW 13.5 02/20/2017   PLT 236 09/09/2019   Last metabolic panel Lab Results  Component Value Date   GLUCOSE 138 (H) 09/12/2017   NA 142 09/09/2019   K 4.8 09/09/2019   CL 107 09/09/2019   CO2 28 (A) 09/09/2019   BUN 23 (A) 09/09/2019   CREATININE 1.2 09/09/2019   GFR 87.08 09/12/2017   CALCIUM 9.5 09/09/2019   PROT 6.7 02/20/2017   ALBUMIN 4.2 09/09/2019   BILITOT 0.8  02/20/2017   ALKPHOS 73 02/20/2017   AST 15 09/09/2019   ALT 12 09/09/2019   Last lipids Lab Results  Component Value Date   CHOL 207 (A) 09/09/2019   HDL 65 09/09/2019   LDLCALC 123 09/09/2019   LDLDIRECT 141.5 02/13/2013   TRIG 93 09/09/2019   CHOLHDL 3 02/20/2017   Last hemoglobin A1c Lab Results  Component Value Date   HGBA1C 5.5 09/09/2019   Last thyroid functions Lab Results  Component Value Date   TSH 2.21 09/09/2019   T3TOTAL 107 09/12/2017   Last vitamin D Lab Results  Component Value Date   VD25OH 13 09/09/2019   Last vitamin B12 and Folate Lab Results  Component Value Date   VITAMINB12 408 09/09/2019   Lab Results  Component Value Date   PSA 0.9  09/09/2019   PSA 1.29 02/20/2017   PSA 0.88 03/08/2016   ASSESSMENT AND PLAN:   No problem-specific Assessment & Plan notes found for this encounter.  Health maintenance exam: Reviewed age and gender appropriate health maintenance issues (prudent diet, regular exercise, health risks of tobacco and excessive alcohol, use of seatbelts, fire alarms in home, use of sunscreen).  Also reviewed age and gender appropriate health screening as well as vaccine recommendations. Vaccines: Shingrix->declined. Flu-->declined.  Prevnar20->declined. Labs: Complete metabolic panel and lipid panel today, PSA today.   Prostate ca screening: PSA Colon ca screening/hx of adenomatous colon poly: was due for recall 04/2018 (Dr. Marina Goodell). New referral ordered today.  Elevated blood pressure without diagnosis of hypertension. Discussed normal blood pressure range. He has a blood pressure cuff at home and will start monitoring regularly for the next 1 to 2 weeks.  If normal then monitor monthly.  If consistently greater than 130/80 then call or return. He did express that he prefers to avoid medications and work on therapeutic lifestyle changes. DASH diet printed for him today.  An After Visit Summary was printed and given to the  patient.  Return in about 1 year (around 05/29/2024) for annual CPE (fasting).  Signed:  Santiago Bumpers, MD           05/30/2023

## 2023-05-30 NOTE — Patient Instructions (Signed)

## 2023-05-31 LAB — COMPREHENSIVE METABOLIC PANEL
ALT: 25 U/L (ref 0–53)
AST: 20 U/L (ref 0–37)
Albumin: 4.6 g/dL (ref 3.5–5.2)
Alkaline Phosphatase: 79 U/L (ref 39–117)
BUN: 12 mg/dL (ref 6–23)
CO2: 31 meq/L (ref 19–32)
Calcium: 9.8 mg/dL (ref 8.4–10.5)
Chloride: 103 meq/L (ref 96–112)
Creatinine, Ser: 1.17 mg/dL (ref 0.40–1.50)
GFR: 65.44 mL/min (ref >60.00)
Glucose, Bld: 89 mg/dL (ref 70–99)
Potassium: 4.9 meq/L (ref 3.5–5.1)
Sodium: 143 meq/L (ref 135–145)
Total Bilirubin: 0.8 mg/dL (ref 0.2–1.2)
Total Protein: 6.8 g/dL (ref 6.0–8.3)

## 2023-05-31 LAB — LIPID PANEL
Cholesterol: 178 mg/dL (ref 0–200)
HDL: 62.9 mg/dL (ref >39.00)
LDL Cholesterol: 99 mg/dL (ref 0–99)
NonHDL: 115.35
Total CHOL/HDL Ratio: 3
Triglycerides: 83 mg/dL (ref 0.0–149.0)
VLDL: 16.6 mg/dL (ref 0.0–40.0)

## 2023-05-31 LAB — PSA, MEDICARE: PSA: 0.97 ng/mL (ref 0.10–4.00)

## 2023-06-24 ENCOUNTER — Encounter: Payer: Self-pay | Admitting: Family Medicine

## 2023-06-26 ENCOUNTER — Encounter: Payer: Self-pay | Admitting: Internal Medicine

## 2023-07-16 ENCOUNTER — Ambulatory Visit (AMBULATORY_SURGERY_CENTER): Payer: Medicare Other

## 2023-07-16 VITALS — Ht 76.0 in | Wt 295.0 lb

## 2023-07-16 DIAGNOSIS — Z1211 Encounter for screening for malignant neoplasm of colon: Secondary | ICD-10-CM

## 2023-07-16 DIAGNOSIS — Z860100 Personal history of colon polyps, unspecified: Secondary | ICD-10-CM

## 2023-07-16 MED ORDER — SUFLAVE 178.7 G PO SOLR: 1.0000 | Freq: Once | ORAL | 0 refills | Status: AC

## 2023-07-16 NOTE — Progress Notes (Signed)

## 2023-07-30 ENCOUNTER — Encounter: Payer: Self-pay | Admitting: Internal Medicine

## 2023-07-30 ENCOUNTER — Ambulatory Visit: Payer: Medicare Other | Admitting: Internal Medicine

## 2023-07-30 VITALS — BP 150/96 | HR 80 | Temp 98.3°F | Resp 15 | Ht 76.0 in | Wt 295.0 lb

## 2023-07-30 DIAGNOSIS — D129 Benign neoplasm of anus and anal canal: Secondary | ICD-10-CM

## 2023-07-30 DIAGNOSIS — D122 Benign neoplasm of ascending colon: Secondary | ICD-10-CM

## 2023-07-30 DIAGNOSIS — K5730 Diverticulosis of large intestine without perforation or abscess without bleeding: Secondary | ICD-10-CM

## 2023-07-30 DIAGNOSIS — Z860100 Personal history of colon polyps, unspecified: Secondary | ICD-10-CM

## 2023-07-30 DIAGNOSIS — K648 Other hemorrhoids: Secondary | ICD-10-CM

## 2023-07-30 DIAGNOSIS — Z1211 Encounter for screening for malignant neoplasm of colon: Secondary | ICD-10-CM

## 2023-07-30 DIAGNOSIS — D124 Benign neoplasm of descending colon: Secondary | ICD-10-CM

## 2023-07-30 DIAGNOSIS — D128 Benign neoplasm of rectum: Secondary | ICD-10-CM

## 2023-07-30 DIAGNOSIS — K621 Rectal polyp: Secondary | ICD-10-CM

## 2023-07-30 DIAGNOSIS — Z860101 Personal history of adenomatous and serrated colon polyps: Secondary | ICD-10-CM

## 2023-07-30 MED ORDER — SODIUM CHLORIDE 0.9 % IV SOLN: 500.0000 mL | Freq: Once | INTRAVENOUS | Status: DC

## 2023-07-30 NOTE — Progress Notes (Signed)
Pt's states no medical or surgical changes since previsit or office visit.

## 2023-07-30 NOTE — Progress Notes (Signed)
HISTORY OF PRESENT ILLNESS:  Raymond Griffith is a 68 y.o. male with a history of adenomatous colon polyps 2014.  Now for follow-up  REVIEW OF SYSTEMS:  All non-GI ROS negative except for  Past Medical History:  Diagnosis Date   Ankle fracture, left    Bimalleolar 2019, ORIF   Borderline hyperlipidemia 2018   TLC   Colon polyps approx age 65-50   Initial screening: Pt doesn't recall whether adenomatous or hyperplastic.  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  Midodrine d/c'd and f/u 1 mo (?syncope related to recent wt loss or recent flu illness?)    Past Surgical History:  Procedure Laterality Date   COLONOSCOPY W/ POLYPECTOMY  approx 2006; 04/2013   04/2013 adenomatous--recall 5 yrs   HEMORRHOID SURGERY  in his 69s   ORIF ANKLE FRACTURE BIMALLEOLAR Left 2019   TONSILLECTOMY AND ADENOIDECTOMY  06/18/1965    Social History Raymond Griffith  reports that he has never smoked. He has never used smokeless tobacco. He reports current alcohol use of about 3.0 standard drinks of alcohol per week. He reports that he does not use drugs.  family history includes Cancer in his father and maternal grandmother; Diabetes in his mother; Esophageal cancer in his father.  No Known Allergies     PHYSICAL EXAMINATION: Vital signs: BP 135/83   Pulse 90   Temp 98.3 F (36.8 C) (Temporal)   Ht 6\' 4"  (1.93 m)   Wt 295 lb (133.8 kg)   SpO2 95%   BMI 35.91 kg/m  General: Well-developed, well-nourished, no acute distress HEENT: Sclerae are anicteric, conjunctiva pink. Oral mucosa intact Lungs: Clear Heart: Regular Abdomen: soft, nontender, nondistended, no obvious ascites, no peritoneal signs, normal bowel sounds. No organomegaly. Extremities: No edema Psychiatric:  alert and oriented x3. Cooperative     ASSESSMENT:  History of adenomatous polyps   PLAN:   Surveillance colonoscopy

## 2023-07-30 NOTE — Progress Notes (Signed)
Called to room to assist during endoscopic procedure.  Patient ID and intended procedure confirmed with present staff. Received instructions for my participation in the procedure from the performing physician.

## 2023-07-30 NOTE — Patient Instructions (Signed)

## 2023-07-30 NOTE — Op Note (Signed)
[facility] Patient Name: Raymond Griffith Procedure Date: 07/30/2023 2:22 PM MRN: [id number] Endoscopist: Wilhemina Bonito. Marina Goodell , MD, [id number] Age: 68 Referring MD:  Date of Birth: March 23, 1956 Gender: Male Account #: [id number] Procedure:                Colonoscopy with cold snare polypectomy x 2; biopsy                            polypectomy x 1 Indications:              High risk colon cancer surveillance: Personal                            history of non-advanced adenoma. Previous exams                            2005 [facility]); 2014 Medicines:                Monitored Anesthesia Care Procedure:                Pre-Anesthesia Assessment:                           - Prior to the procedure, a History and Physical                            was performed, and patient medications and                            allergies were reviewed. The patient's tolerance of                            previous anesthesia was also reviewed. The risks                            and benefits of the procedure and the sedation                            options and risks were discussed with the patient.                            All questions were answered, and informed consent                            was obtained. Prior Anticoagulants: The patient has                            taken no anticoagulant or antiplatelet agents. ASA                            Grade Assessment: II - A patient with mild systemic                            disease. After reviewing the risks and benefits,  the patient was deemed in satisfactory condition to                            undergo the procedure.                           After obtaining informed consent, the colonoscope                            was passed under direct vision. Throughout the                            procedure, the patient's blood pressure, pulse, and                            oxygen saturations were monitored  continuously. The                            Olympus CF-HQ190L ([id number]) Colonoscope was                            introduced through the anus and advanced to the the                            cecum, identified by appendiceal orifice and                            ileocecal valve. The ileocecal valve, appendiceal                            orifice, and rectum were photographed. The quality                            of the bowel preparation was excellent. The                            colonoscopy was performed without difficulty. The                            patient tolerated the procedure well. The bowel                            preparation used was SUPREP via split dose                            instruction. Scope In: 2:41:25 PM Scope Out: 2:57:57 PM Scope Withdrawal Time: 0 hours 15 minutes 9 seconds  Total Procedure Duration: 0 hours 16 minutes 32 seconds  Findings:                 Two polyps were found in the descending colon and                            ascending colon. The polyps were 2 to 4 mm in size.  These polyps were removed with a cold snare.                            Resection and retrieval were complete.                           A 1 mm polyp was found in the rectum. The polyp was                            removed with a jumbo cold forceps. Resection and                            retrieval were complete.                           Multiple diverticula were found in the left colon.                           Internal hemorrhoids were found during retroflexion.                           The exam was otherwise without abnormality on                            direct and retroflexion views. Complications:            No immediate complications. Estimated blood loss:                            None. Estimated Blood Loss:     Estimated blood loss: none. Impression:               - Two 2 to 4 mm polyps in the descending colon and                             in the ascending colon, removed with a cold snare.                            Resected and retrieved.                           - One 1 mm polyp in the rectum, removed with a                            jumbo cold forceps. Resected and retrieved.                           - Diverticulosis in the left colon.                           - Internal hemorrhoids.                           - The examination was otherwise normal on direct  and retroflexion views. Recommendation:           - Repeat colonoscopy in 5-10 years for surveillance.                           - Patient has a contact number available for                            emergencies. The signs and symptoms of potential                            delayed complications were discussed with the                            patient. Return to normal activities tomorrow.                            Written discharge instructions were provided to the                            patient.                           - Resume previous diet.                           - Continue present medications.                           - Await pathology results. Wilhemina Bonito. Marina Goodell, MD 07/30/2023 3:04:49 PM This report has been signed electronically.

## 2023-07-31 ENCOUNTER — Telehealth: Payer: Self-pay | Admitting: *Deleted

## 2023-07-31 NOTE — Telephone Encounter (Signed)
  Follow up Call-     07/30/2023    1:49 PM  Call back number  Post procedure Call Back phone  # [phone]  Permission to leave phone message Yes     Patient questions:  Do you have a fever, pain , or abdominal swelling? No. Pain Score  0 *  Have you tolerated food without any problems? Yes.    Have you been able to return to your normal activities? Yes.    Do you have any questions about your discharge instructions: Diet   No. Medications  No. Follow up visit  No.  Do you have questions or concerns about your Care? No.  Actions: * If pain score is 4 or above: No action needed, pain <4.

## 2023-08-05 LAB — SURGICAL PATHOLOGY

## 2023-08-07 ENCOUNTER — Encounter: Payer: Self-pay | Admitting: Internal Medicine

## 2024-06-02 ENCOUNTER — Ambulatory Visit: Payer: Medicare Other | Admitting: Family Medicine

## 2024-06-02 ENCOUNTER — Ambulatory Visit: Payer: Self-pay | Admitting: Family Medicine

## 2024-06-02 ENCOUNTER — Encounter: Payer: Self-pay | Admitting: Family Medicine

## 2024-06-02 VITALS — BP 134/78 | HR 75 | Temp 97.2°F | Ht 76.0 in | Wt 286.2 lb

## 2024-06-02 DIAGNOSIS — R030 Elevated blood-pressure reading, without diagnosis of hypertension: Secondary | ICD-10-CM | POA: Diagnosis not present

## 2024-06-02 DIAGNOSIS — E66811 Obesity, class 1: Secondary | ICD-10-CM

## 2024-06-02 DIAGNOSIS — Z125 Encounter for screening for malignant neoplasm of prostate: Secondary | ICD-10-CM

## 2024-06-02 LAB — COMPREHENSIVE METABOLIC PANEL WITH GFR
ALT: 19 U/L (ref 3–53)
AST: 18 U/L (ref 5–37)
Albumin: 4.3 g/dL (ref 3.5–5.2)
Alkaline Phosphatase: 73 U/L (ref 39–117)
BUN: 19 mg/dL (ref 6–23)
CO2: 31 meq/L (ref 19–32)
Calcium: 9.6 mg/dL (ref 8.4–10.5)
Chloride: 105 meq/L (ref 96–112)
Creatinine, Ser: 1.11 mg/dL (ref 0.40–1.50)
GFR: 69.22 mL/min (ref >60.00)
Glucose, Bld: 103 mg/dL — ABNORMAL HIGH (ref 70–99)
Potassium: 4.4 meq/L (ref 3.5–5.1)
Sodium: 141 meq/L (ref 135–145)
Total Bilirubin: 0.7 mg/dL (ref 0.2–1.2)
Total Protein: 6.2 g/dL (ref 6.0–8.3)

## 2024-06-02 LAB — LIPID PANEL
Cholesterol: 187 mg/dL (ref 28–200)
HDL: 59.6 mg/dL (ref >39.00)
LDL Cholesterol: 111 mg/dL — ABNORMAL HIGH (ref 10–99)
NonHDL: 126.96
Total CHOL/HDL Ratio: 3
Triglycerides: 78 mg/dL (ref 10.0–149.0)
VLDL: 15.6 mg/dL (ref 0.0–40.0)

## 2024-06-02 LAB — PSA, MEDICARE: PSA: 0.98 ng/mL (ref 0.10–4.00)

## 2024-06-02 NOTE — Progress Notes (Signed)
 Office Note 06/02/2024  CC:  Chief Complaint  Patient presents with   Medical Management of Chronic Issues    Pt is fasting    HPI:  Patient is a 68 y.o. male who is here for physical, elev bp w/out dx htn, obesity class 1.  Feeling well. He has been doing well with purposeful weight loss. He is down 15 pounds since I saw him last a year ago.  He is down about 30 pounds from his peak weight.  Blood pressures at home consistently less than 130/80. He rarely drinks alcohol. He does not smoke.  Still working very long hours but may possibly retire next year.  Past Medical History:  Diagnosis Date   Ankle fracture, left    Bimalleolar 2019, ORIF   Borderline hyperlipidemia 2018   TLC   Colon polyps approx age 55-50   Initial screening: Pt doesn't recall whether adenomatous or hyperplastic.  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  Midodrine  d/c'd and f/u 1 mo (?syncope related to recent wt loss or recent flu illness?)    Past Surgical History:  Procedure Laterality Date   COLONOSCOPY W/ POLYPECTOMY  approx 2006; 04/2013   04/2013 adenomatous.  07/2023 adenoma x 1--->recall 63yrs   HEMORRHOID SURGERY  in his 43s   ORIF ANKLE FRACTURE BIMALLEOLAR Left 2019   TONSILLECTOMY AND ADENOIDECTOMY  06/18/1965    Family History  Problem Relation Age of Onset   Diabetes Mother    Esophageal cancer Father    Cancer Father    Cancer Maternal Grandmother    Colon cancer Neg Hx    Colon polyps Neg Hx    Rectal cancer Neg Hx    Stomach cancer Neg Hx     Social History   Socioeconomic History   Marital status: Married    Spouse name: Nathanel   Number of children: Not on file   Years of education: Not on file   Highest education level: Not on file   Occupational History    Employer: [employer]  Tobacco Use   Smoking status: Never   Smokeless tobacco: Never  Vaping Use   Vaping status: Never Used  Substance and Sexual Activity   Alcohol use: Yes    Alcohol/week: 3.0 standard drinks of alcohol    Types: 3 Cans of beer per week   Drug use: No   Sexual activity: Yes    Partners: Female  Other Topics Concern   Not on file  Social History Narrative   Married, one adult son.   Originally from [place] but went to [employer], got accounting degree.   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   Exercises in spurts only.  No T/A/Ds.   Lives in [place]   Social Drivers of Health   Tobacco Use: Low Risk (06/02/2024)   Patient History    Smoking Tobacco Use: Never    Smokeless Tobacco Use: Never    Passive Exposure: Not on file  Financial Resource Strain: Not on file  Food Insecurity: Not on file  Transportation Needs: Not on file  Physical Activity: Not on file  Stress: Not on file  Social Connections: Not on file  Intimate Partner Violence: Not on file  Depression (  PHQ2-9): Low Risk (06/02/2024)   Depression (PHQ2-9)    PHQ-2 Score: 0  Alcohol Screen: Not on file  Housing: Not on file  Utilities: Not on file  Health Literacy: Not on file    Outpatient Medications Prior to Visit  Medication Sig Dispense Refill   Multiple Vitamin (MULTI VITAMIN) TABS 1 tablet Orally Once a day     No facility-administered medications prior to visit.    Allergies[1]  Review of Systems  Constitutional:  Negative for appetite change, chills, fatigue and fever.  HENT:  Negative for congestion, dental problem, ear pain and sore throat.   Eyes:  Negative for discharge, redness and visual disturbance.  Respiratory:  Negative for cough, chest tightness, shortness of breath and wheezing.   Cardiovascular:  Negative for chest pain, palpitations and leg swelling.  Gastrointestinal:  Negative for abdominal  pain, blood in stool, diarrhea, nausea and vomiting.  Genitourinary:  Negative for difficulty urinating, dysuria, flank pain, frequency, hematuria and urgency.  Musculoskeletal:  Negative for arthralgias, back pain, joint swelling, myalgias and neck stiffness.  Skin:  Negative for pallor and rash.  Neurological:  Negative for dizziness, speech difficulty, weakness and headaches.  Hematological:  Negative for adenopathy. Does not bruise/bleed easily.  Psychiatric/Behavioral:  Negative for confusion and sleep disturbance. The patient is not nervous/anxious.     PE;    06/02/2024    8:47 AM 06/02/2024    8:42 AM 07/30/2023    3:21 PM  Vitals with BMI  Height  6' 4   Weight  286 lbs 3 oz   BMI  34.85   Systolic 134 142 849  Diastolic 78 93 96  Pulse  75 80     Gen: Alert, well appearing.  Patient is oriented to person, place, time, and situation. AFFECT: pleasant, lucid thought and speech. NECK; no bruits. CV: RRR, no m/r/g.   LUNGS: CTA bilat, nonlabored resps, good aeration in all lung fields. EXT: no clubbing or cyanosis.  no edema.   Pertinent labs:  Lab Results  Component Value Date   TSH 2.21 09/09/2019   Lab Results  Component Value Date   WBC 6.6 09/09/2019   HGB 15.0 09/09/2019   HCT 45 09/09/2019   MCV 89.9 02/20/2017   PLT 236 09/09/2019   Lab Results  Component Value Date   CREATININE 1.17 05/30/2023   BUN 12 05/30/2023   NA 143 05/30/2023   K 4.9 05/30/2023   CL 103 05/30/2023   CO2 31 05/30/2023   Lab Results  Component Value Date   ALT 25 05/30/2023   AST 20 05/30/2023   ALKPHOS 79 05/30/2023   BILITOT 0.8 05/30/2023   Lab Results  Component Value Date   CHOL 178 05/30/2023   Lab Results  Component Value Date   HDL 62.90 05/30/2023   Lab Results  Component Value Date   LDLCALC 99 05/30/2023   Lab Results  Component Value Date   TRIG 83.0 05/30/2023   Lab Results  Component Value Date   CHOLHDL 3 05/30/2023   Lab Results   Component Value Date   PSA 0.97 05/30/2023   PSA 0.9 09/09/2019   PSA 1.29 02/20/2017   Lab Results  Component Value Date   HGBA1C 5.5 09/09/2019   ASSESSMENT AND PLAN:   #1 elevated blood pressure without diagnosis of hypertension. Home blood pressure is good. BP is usually a little bit high here. He will continue DASH diet and maximize cardiovascular exercise.    #2  obesity, class I. Doing excellent with gradual purposeful weight loss.  #3 preventative health care: Vaccines: Shingrix->declined. Flu-->declined.  Prevnar20->declined. Labs: Complete metabolic panel and lipid panel today, PSA today.   Prostate ca screening: PSA Colon ca screening/hx of adenomatous colon polyps, most recent colonoscopy was 07/2023.  GI recommended recall 7 years.  An After Visit Summary was printed and given to the patient.  FOLLOW UP:  Return in about 1 year (around 06/02/2025) for annual CPE (fasting).  Signed:  Gerlene Hockey, MD           06/02/2024     [1] No Known Allergies

## 2024-06-02 NOTE — Patient Instructions (Signed)
 Health Maintenance, Male  Adopting a healthy lifestyle and getting preventive care are important in promoting health and wellness. Ask your health care provider about:  The right schedule for you to have regular tests and exams.  Things you can do on your own to prevent diseases and keep yourself healthy.  What should I know about diet, weight, and exercise?  Eat a healthy diet    Eat a diet that includes plenty of vegetables, fruits, low-fat dairy products, and lean protein.  Do not eat a lot of foods that are high in solid fats, added sugars, or sodium.  Maintain a healthy weight  Body mass index (BMI) is a measurement that can be used to identify possible weight problems. It estimates body fat based on height and weight. Your health care provider can help determine your BMI and help you achieve or maintain a healthy weight.  Get regular exercise  Get regular exercise. This is one of the most important things you can do for your health. Most adults should:  Exercise for at least 150 minutes each week. The exercise should increase your heart rate and make you sweat (moderate-intensity exercise).  Do strengthening exercises at least twice a week. This is in addition to the moderate-intensity exercise.  Spend less time sitting. Even light physical activity can be beneficial.  Watch cholesterol and blood lipids  Have your blood tested for lipids and cholesterol at 66 years of age, then have this test every 5 years.  You may need to have your cholesterol levels checked more often if:  Your lipid or cholesterol levels are high.  You are older than 66 years of age.  You are at high risk for heart disease.  What should I know about cancer screening?  Many types of cancers can be detected early and may often be prevented. Depending on your health history and family history, you may need to have cancer screening at various ages. This may include screening for:  Colorectal cancer.  Prostate cancer.  Skin cancer.  Lung  cancer.  What should I know about heart disease, diabetes, and high blood pressure?  Blood pressure and heart disease  High blood pressure causes heart disease and increases the risk of stroke. This is more likely to develop in people who have high blood pressure readings or are overweight.  Talk with your health care provider about your target blood pressure readings.  Have your blood pressure checked:  Every 3-5 years if you are 24-52 years of age.  Every year if you are 3 years old or older.  If you are between the ages of 60 and 72 and are a current or former smoker, ask your health care provider if you should have a one-time screening for abdominal aortic aneurysm (AAA).  Diabetes  Have regular diabetes screenings. This checks your fasting blood sugar level. Have the screening done:  Once every three years after age 66 if you are at a normal weight and have a low risk for diabetes.  More often and at a younger age if you are overweight or have a high risk for diabetes.  What should I know about preventing infection?  Hepatitis B  If you have a higher risk for hepatitis B, you should be screened for this virus. Talk with your health care provider to find out if you are at risk for hepatitis B infection.  Hepatitis C  Blood testing is recommended for:  Everyone born from [birth year] through 1963.  Anyone  with known risk factors for hepatitis C.  Sexually transmitted infections (STIs)  You should be screened each year for STIs, including gonorrhea and chlamydia, if:  You are sexually active and are younger than 66 years of age.  You are older than 66 years of age and your health care provider tells you that you are at risk for this type of infection.  Your sexual activity has changed since you were last screened, and you are at increased risk for chlamydia or gonorrhea. Ask your health care provider if you are at risk.  Ask your health care provider about whether you are at high risk for HIV. Your health care provider  may recommend a prescription medicine to help prevent HIV infection. If you choose to take medicine to prevent HIV, you should first get tested for HIV. You should then be tested every 3 months for as long as you are taking the medicine.  Follow these instructions at home:  Alcohol use  Do not drink alcohol if your health care provider tells you not to drink.  If you drink alcohol:  Limit how much you have to 0-2 drinks a day.  Know how much alcohol is in your drink. In the [place] one drink equals one 12 oz bottle of beer (355 mL), one 5 oz glass of wine (148 mL), or one 1 oz glass of hard liquor (44 mL).  Lifestyle  Do not use any products that contain nicotine or tobacco. These products include cigarettes, chewing tobacco, and vaping devices, such as e-cigarettes. If you need help quitting, ask your health care provider.  Do not use street drugs.  Do not share needles.  Ask your health care provider for help if you need support or information about quitting drugs.  General instructions  Schedule regular health, dental, and eye exams.  Stay current with your vaccines.  Tell your health care provider if:  You often feel depressed.  You have ever been abused or do not feel safe at home.  Summary  Adopting a healthy lifestyle and getting preventive care are important in promoting health and wellness.  Follow your health care provider's instructions about healthy diet, exercising, and getting tested or screened for diseases.  Follow your health care provider's instructions on monitoring your cholesterol and blood pressure.  This information is not intended to replace advice given to you by your health care provider. Make sure you discuss any questions you have with your health care provider.  Document Revised: 10/24/2020 Document Reviewed: 10/24/2020  [employer]vier Patient Education  2024 [employer].

## 2025-06-03 ENCOUNTER — Encounter: Admitting: Family Medicine
